# Patient Record
Sex: Female | Born: 1995 | Race: Black or African American | Hispanic: No | Marital: Single | Smoking: Never smoker
Health system: Southern US, Community
[De-identification: ages and names within clinical notes are randomized; demographics above are authoritative.]

## PROBLEM LIST (undated history)

## (undated) ENCOUNTER — Inpatient Hospital Stay (HOSPITAL_COMMUNITY): Payer: Self-pay

## (undated) DIAGNOSIS — L52 Erythema nodosum: Secondary | ICD-10-CM

## (undated) DIAGNOSIS — N6320 Unspecified lump in the left breast, unspecified quadrant: Secondary | ICD-10-CM

## (undated) DIAGNOSIS — R739 Hyperglycemia, unspecified: Secondary | ICD-10-CM

## (undated) DIAGNOSIS — E0590 Thyrotoxicosis, unspecified without thyrotoxic crisis or storm: Secondary | ICD-10-CM

## (undated) DIAGNOSIS — L309 Dermatitis, unspecified: Secondary | ICD-10-CM

## (undated) DIAGNOSIS — D649 Anemia, unspecified: Secondary | ICD-10-CM

## (undated) HISTORY — DX: Hyperglycemia, unspecified: R73.9

## (undated) HISTORY — DX: Anemia, unspecified: D64.9

## (undated) HISTORY — PX: BREAST BIOPSY: SHX20

## (undated) HISTORY — DX: Erythema nodosum: L52

## (undated) HISTORY — DX: Unspecified lump in the left breast, unspecified quadrant: N63.20

## (undated) HISTORY — PX: NO PAST SURGERIES: SHX2092

---

## 2017-10-07 HISTORY — PX: BREAST BIOPSY: SHX20

## 2020-04-06 LAB — OB RESULTS CONSOLE GC/CHLAMYDIA
Chlamydia: NEGATIVE
Gonorrhea: NEGATIVE

## 2020-04-06 LAB — OB RESULTS CONSOLE RUBELLA ANTIBODY, IGM: Rubella: IMMUNE

## 2020-04-06 LAB — OB RESULTS CONSOLE ABO/RH: RH Type: POSITIVE

## 2020-04-06 LAB — OB RESULTS CONSOLE HEPATITIS B SURFACE ANTIGEN: Hepatitis B Surface Ag: NEGATIVE

## 2020-04-06 LAB — OB RESULTS CONSOLE HIV ANTIBODY (ROUTINE TESTING): HIV: NONREACTIVE

## 2020-06-06 ENCOUNTER — Other Ambulatory Visit: Payer: Self-pay

## 2020-06-06 ENCOUNTER — Encounter (HOSPITAL_BASED_OUTPATIENT_CLINIC_OR_DEPARTMENT_OTHER): Payer: Self-pay | Admitting: *Deleted

## 2020-06-06 ENCOUNTER — Inpatient Hospital Stay (HOSPITAL_BASED_OUTPATIENT_CLINIC_OR_DEPARTMENT_OTHER)
Admission: AD | Admit: 2020-06-06 | Discharge: 2020-06-06 | Discharge status: Against Medical Advice | Payer: Medicaid Other | Attending: Obstetrics and Gynecology | Admitting: Obstetrics and Gynecology

## 2020-06-06 DIAGNOSIS — Z3A14 14 weeks gestation of pregnancy: Secondary | ICD-10-CM | POA: Diagnosis not present

## 2020-06-06 DIAGNOSIS — R109 Unspecified abdominal pain: Secondary | ICD-10-CM

## 2020-06-06 DIAGNOSIS — R1030 Lower abdominal pain, unspecified: Secondary | ICD-10-CM | POA: Diagnosis not present

## 2020-06-06 DIAGNOSIS — O26892 Other specified pregnancy related conditions, second trimester: Secondary | ICD-10-CM

## 2020-06-06 DIAGNOSIS — Z3A13 13 weeks gestation of pregnancy: Secondary | ICD-10-CM

## 2020-06-06 HISTORY — DX: Thyrotoxicosis, unspecified without thyrotoxic crisis or storm: E05.90

## 2020-06-06 HISTORY — DX: Dermatitis, unspecified: L30.9

## 2020-06-06 LAB — URINALYSIS, ROUTINE W REFLEX MICROSCOPIC
Bilirubin Urine: NEGATIVE
Glucose, UA: NEGATIVE mg/dL
Hgb urine dipstick: NEGATIVE
Ketones, ur: 15 mg/dL — AB
Leukocytes,Ua: NEGATIVE
Nitrite: NEGATIVE
Protein, ur: NEGATIVE mg/dL
Specific Gravity, Urine: 1.02 (ref 1.005–1.030)
pH: 8.5 — ABNORMAL HIGH (ref 5.0–8.0)

## 2020-06-06 LAB — PREGNANCY, URINE: Preg Test, Ur: POSITIVE — AB

## 2020-06-06 LAB — WET PREP, GENITAL
Sperm: NONE SEEN
Trich, Wet Prep: NONE SEEN
Yeast Wet Prep HPF POC: NONE SEEN

## 2020-06-06 MED ORDER — ACETAMINOPHEN 500 MG PO TABS
1000.0000 mg | ORAL_TABLET | Freq: Once | ORAL | Status: AC
  Administered 2020-06-06: 1000 mg via ORAL
  Filled 2020-06-06: qty 2

## 2020-06-06 NOTE — ED Triage Notes (Signed)
Pt reports lower abd pain x this am, denies any bleeding or discharge.

## 2020-06-06 NOTE — MAU Provider Note (Cosign Needed)
History     CSN: [id number]  Arrival date and time: 06/06/20 1202   First Provider Initiated Contact with Patient 06/06/20 1231      Chief Complaint  Patient presents with  . pregnant-abd pain   24 y.o. G3P1011 @13 .6 wks sent from [facility] with [facility]. Pain started this am. Pain was initially 8/10, now 5/10 after Tylenol. Pain is worse on left side. Denies fevers. Hx of constipation with last BM 2 days ago. Denies N/V. Denies vaginal bleeding, discharge, itching, or malodor. She was getting care in [facility] and told she had [facility] and placental separation based on [facility].  OB History    Gravida  3   Para  1   Term  1   Preterm  0   AB  1   Living  1     SAB  1   TAB      Ectopic      Multiple      Live Births  1           Past Medical History:  Diagnosis Date  . Eczema   . Hyperthyroidism     Past Surgical History:  Procedure Laterality Date  . NO PAST SURGERIES      Family History  Problem Relation Age of Onset  . Arthritis Mother        rheumatoid  . Hyperlipidemia Mother   . Healthy Father     Social History   Tobacco Use  . Smoking status: Never Smoker  . Smokeless tobacco: Never Used  Vaping Use  . Vaping Use: Never used  Substance Use Topics  . Alcohol use: Never  . Drug use: Never    Allergies: No Known Allergies  Medications Prior to Admission  Medication Sig Dispense Refill Last Dose  . Prenatal Vit-Fe Fumarate-FA (PRENATAL VITAMIN PO) Take by mouth.   06/05/2020 at Unknown time    Review of Systems  Constitutional: Negative for chills and fever.  Gastrointestinal: Positive for abdominal pain and constipation.  Genitourinary: Negative for dysuria, frequency, urgency, vaginal bleeding and vaginal discharge.   Physical Exam   Blood pressure 112/70, pulse 86, temperature 98.9 F (37.2 C), temperature source Oral, resp. rate 18, height 5\' 5"  (1.651 m), weight 69.2 kg, last menstrual period 02/28/2020, SpO2 100 %.  Physical Exam Vitals and  nursing note reviewed. Exam conducted with a chaperone present.  Constitutional:      Appearance: Normal appearance.  HENT:     Head: Normocephalic and atraumatic.  Cardiovascular:     Rate and Rhythm: Normal rate.  Pulmonary:     Effort: Pulmonary effort is normal. No respiratory distress.  Abdominal:     General: There is no distension.     Palpations: Abdomen is soft.     Tenderness: There is abdominal tenderness in the left lower quadrant.  Genitourinary:    Comments: External: no lesions or erythema Uterus: + enlarged 14wk, anteverted, non tender, no CMT Adnexae: no masses, no tenderness left, no tenderness right Cervix closed/long  Musculoskeletal:        General: Normal range of motion.     Cervical back: Normal range of motion.  Skin:    General: Skin is warm and dry.  Neurological:     General: No focal deficit present.     Mental Status: She is alert and oriented to person, place, and time.  Psychiatric:        Mood and Affect: Mood normal.   FHT 148  Results for orders placed or performed during the hospital encounter of 06/06/20 (from the past 24 hour(s))  Pregnancy, urine     Status: Abnormal   Collection Time: 06/06/20 10:27 AM  Result Value Ref Range   Preg Test, Ur POSITIVE (A) NEGATIVE  Urinalysis, Routine w reflex microscopic Urine, Clean Catch     Status: Abnormal   Collection Time: 06/06/20 10:27 AM  Result Value Ref Range   Color, Urine YELLOW YELLOW   APPearance CLOUDY (A) CLEAR   Specific Gravity, Urine 1.020 1.005 - 1.030   pH 8.5 (H) 5.0 - 8.0   Glucose, UA NEGATIVE NEGATIVE mg/dL   Hgb urine dipstick NEGATIVE NEGATIVE   Bilirubin Urine NEGATIVE NEGATIVE   Ketones, ur 15 (A) NEGATIVE mg/dL   Protein, ur NEGATIVE NEGATIVE mg/dL   Nitrite NEGATIVE NEGATIVE   Leukocytes,Ua NEGATIVE NEGATIVE  Wet prep, genital     Status: Abnormal   Collection Time: 06/06/20 12:44 PM   Specimen: PATH Cytology Cervicovaginal Ancillary Only  Result Value Ref  Range   Yeast Wet Prep HPF POC NONE SEEN NONE SEEN   Trich, Wet Prep NONE SEEN NONE SEEN   Clue Cells Wet Prep HPF POC PRESENT (A) NONE SEEN   WBC, Wet Prep HPF POC MANY (A) NONE SEEN   Sperm NONE SEEN    MAU Course  Procedures  MDM Labs ordered. Pt left AMA before completion of evaluation.  Assessment and Plan   1. Abdominal pain during pregnancy in second trimester   2. [redacted] weeks gestation of pregnancy    Left AMA Follow up at [facility] to start care- has appt  Donette Larry , CNM 06/06/2020, 12:43 PM

## 2020-06-06 NOTE — ED Notes (Signed)
ED Provider at bedside. 

## 2020-06-06 NOTE — ED Notes (Signed)
PER ED MD, all labs except urine preg, will be on hold and done at MAU

## 2020-06-06 NOTE — ED Provider Notes (Signed)
[facility] EMERGENCY DEPARTMENT Provider Note   CSN: [id number] Arrival date & time: 06/06/20  1314     History Chief Complaint  Patient presents with  . pregnant-abd pain    Alicia Turner is a 24 y.o. female.  24 yo F with a chief complaints of lower abdominal pain.  Is been going on since this morning.  Severe and sudden.  No nausea or vomiting.  Denies any urinary symptoms denies vaginal bleeding or discharge.  The patient is approximately [redacted] weeks pregnant.  And had an ultrasound a couple weeks ago in [place] that showed that she had a subchorionic hemorrhage.  This is her third pregnancy.  She has a 70-year-old child at home.  That pregnancy no complications.  She also has had a spontaneous miscarriage.  The history is provided by the patient.  Illness Severity:  Moderate Onset quality:  Sudden Duration:  12 hours Timing:  Constant Progression:  Unchanged Chronicity:  New Associated symptoms: abdominal pain   Associated symptoms: no chest pain, no congestion, no fever, no headaches, no myalgias, no nausea, no rhinorrhea, no shortness of breath, no vomiting and no wheezing        History reviewed. No pertinent past medical history.  There are no problems to display for this patient.   History reviewed. No pertinent surgical history.   OB History    Gravida  1   Para      Term      Preterm      AB      Living        SAB      TAB      Ectopic      Multiple      Live Births              No family history on file.  Social History   Tobacco Use  . Smoking status: Not on file  Substance Use Topics  . Alcohol use: Not on file  . Drug use: Not on file    Home Medications Prior to Admission medications   Not on File    Allergies    Patient has no known allergies.  Review of Systems   Review of Systems  Constitutional: Negative for chills and fever.  HENT: Negative for congestion and rhinorrhea.   Eyes: Negative  for redness and visual disturbance.  Respiratory: Negative for shortness of breath and wheezing.   Cardiovascular: Negative for chest pain and palpitations.  Gastrointestinal: Positive for abdominal pain. Negative for nausea and vomiting.  Genitourinary: Positive for pelvic pain. Negative for dysuria, urgency, vaginal bleeding and vaginal discharge.  Musculoskeletal: Negative for arthralgias and myalgias.  Skin: Negative for pallor and wound.  Neurological: Negative for dizziness and headaches.    Physical Exam Updated Vital Signs BP (!) 122/54 (BP Location: Right Arm)   Pulse (!) 55   Temp 98.6 F (37 C) (Oral)   Resp (!) 22   Ht 5\' 5"  (1.651 m)   Wt 71.7 kg   LMP 02/28/2020   SpO2 100%   BMI 26.29 kg/m   Physical Exam Vitals and nursing note reviewed.  Constitutional:      General: She is not in acute distress.    Appearance: She is well-developed. She is not diaphoretic.  HENT:     Head: Normocephalic and atraumatic.  Eyes:     Pupils: Pupils are equal, round, and reactive to light.  Cardiovascular:     Rate  and Rhythm: Normal rate and regular rhythm.     Heart sounds: No murmur heard.  No friction rub. No gallop.   Pulmonary:     Effort: Pulmonary effort is normal.     Breath sounds: No wheezing or rales.  Abdominal:     General: There is no distension.     Palpations: Abdomen is soft.     Tenderness: There is abdominal tenderness.     Comments: Tenderness about the suprapubic and left lower quadrant region.  Musculoskeletal:        General: No tenderness.     Cervical back: Normal range of motion and neck supple.  Skin:    General: Skin is warm and dry.  Neurological:     Mental Status: She is alert and oriented to person, place, and time.  Psychiatric:        Behavior: Behavior normal.     ED Results / Procedures / Treatments   Labs (all labs ordered are listed, but only abnormal results are displayed) Labs Reviewed  WET PREP, GENITAL  PREGNANCY,  URINE  URINALYSIS, ROUTINE W REFLEX MICROSCOPIC  CBC WITH DIFFERENTIAL/PLATELET  COMPREHENSIVE METABOLIC PANEL  LIPASE, BLOOD  HCG, QUANTITATIVE, PREGNANCY  ABO/RH  GC/CHLAMYDIA PROBE AMP ([place]) NOT AT [facility]    EKG None  Radiology No results found.  Procedures Procedures (including critical care time)  Medications Ordered in ED Medications - No data to display  ED Course  I have reviewed the triage vital signs and the nursing notes.  Pertinent labs & imaging results that were available during my care of the patient were reviewed by me and considered in my medical decision making (see chart for details).    MDM Rules/Calculators/A&P                          24 yo F with a chief complaints of lower abdominal discomfort.  Going on since this morning.  Nothing seems to make it better or worse.  No fevers no vomiting no vaginal bleeding or discharge no urinary symptoms.  The patient is [redacted] weeks pregnant.  Unfortunately we do not have ultrasound capability this morning.  I discussed the case with the MAU provider who accepted the patient in transfer there.  Let the patient go POV.  The patients results and plan were reviewed and discussed.   Any x-rays performed were independently reviewed by myself.   Differential diagnosis were considered with the presenting HPI.  Medications - No data to display  Vitals:   06/06/20 0943 06/06/20 1001  BP:  (!) 122/54  Pulse:  (!) 55  Resp:  (!) 22  Temp:  98.6 F (37 C)  TempSrc:  Oral  SpO2:  100%  Weight: 71.7 kg   Height: 5\' 5"  (1.651 m)     Final diagnoses:  Abdominal pain during pregnancy in second trimester     Final Clinical Impression(s) / ED Diagnoses Final diagnoses:  Abdominal pain during pregnancy in second trimester    Rx / DC Orders ED Discharge Orders    None       , DO 06/06/20 1106

## 2020-06-06 NOTE — ED Notes (Signed)
Unable to speak to nurse at [facility], spoke with Gearldine Bienenstock, Pt Coordinator, states will have RN to return call.

## 2020-06-06 NOTE — MAU Note (Signed)
Sharp pain across lower abd started this morning.  Denies bleeding or d/c. Sent over from [facility] for [place] and further eval.  Was given pain medication there, so is starting to improve.

## 2020-06-06 NOTE — ED Notes (Signed)
Report given to [facility] RN at [facility]

## 2020-06-06 NOTE — Discharge Instructions (Addendum)
Go to the MAU now.  There are OB/GYN providers that can evaluate you for your abdominal pain during pregnancy.  There is also ultrasound capability available.

## 2020-06-06 NOTE — ED Notes (Signed)
Transferred to MAU, via private vehicle

## 2020-06-07 LAB — GC/CHLAMYDIA PROBE AMP (~~LOC~~) NOT AT ARMC
Chlamydia: NEGATIVE
Comment: NEGATIVE
Comment: NORMAL
Neisseria Gonorrhea: NEGATIVE

## 2020-06-11 ENCOUNTER — Encounter (HOSPITAL_COMMUNITY): Payer: Self-pay

## 2020-06-11 ENCOUNTER — Other Ambulatory Visit: Payer: Self-pay

## 2020-06-11 ENCOUNTER — Inpatient Hospital Stay (HOSPITAL_COMMUNITY)
Admission: AD | Admit: 2020-06-11 | Discharge: 2020-06-11 | Disposition: A | Discharge status: Home / Self Care | Payer: Medicaid Other | Attending: Obstetrics and Gynecology | Admitting: Obstetrics and Gynecology

## 2020-06-11 DIAGNOSIS — E039 Hypothyroidism, unspecified: Secondary | ICD-10-CM | POA: Insufficient documentation

## 2020-06-11 DIAGNOSIS — N6310 Unspecified lump in the right breast, unspecified quadrant: Secondary | ICD-10-CM

## 2020-06-11 DIAGNOSIS — Z3A14 14 weeks gestation of pregnancy: Secondary | ICD-10-CM | POA: Diagnosis not present

## 2020-06-11 DIAGNOSIS — N644 Mastodynia: Secondary | ICD-10-CM

## 2020-06-11 DIAGNOSIS — O9229 Other disorders of breast associated with pregnancy and the puerperium: Secondary | ICD-10-CM | POA: Diagnosis not present

## 2020-06-11 NOTE — ED Triage Notes (Signed)
Emergency Medicine Provider OB Triage Evaluation Note  Alicia Turner is a 24 y.o. female, G3P1011, at [redacted]w[redacted]d gestation who presents to the emergency department with complaints of right breast pain x 3 weeks.  Review of  Systems  Positive: breast pain Negative: redness, drainage  Physical Exam  BP 114/65 (BP Location: Right Arm)   Pulse 71   Temp 98.8 F (37.1 C) (Oral)   Resp 16   LMP 02/28/2020 Comment: Gravida 3, Para 1, AB 1; edc by [place] end of July  SpO2 99%  General: Awake, no distress  HEENT: Atraumatic  Resp: Normal effort  Cardiac: Normal rate Abd: Nondistended, nontender  MSK: Moves all extremities without difficulty Neuro: Speech clear  Medical Decision Making  Pt evaluated for pregnancy concern and is stable for transfer to MAU. Pt is in agreement with plan for transfer.  11:26 AM Discussed with MAU APP, who accepts patient in transfer.  Clinical Impression  No diagnosis found.     Jeannie Fend, PA-C 06/11/20 1126

## 2020-06-11 NOTE — ED Triage Notes (Signed)
Patient complains of right breast pain for several weeks. States that she is [redacted] weeks pregnant and concerned with breast pain. No redness noted

## 2020-06-11 NOTE — MAU Provider Note (Signed)
Chief Complaint: Breast Pain   First Provider Initiated Contact with Patient 06/11/20 1350    SUBJECTIVE HPI: Alicia Turner is a 24 y.o. G3P1011 at [redacted]w[redacted]d by LMP who presents to maternity admissions reporting concern of painful right breast mass. Pt reports worsening breast pain over the past several weeks that has continued to worsen despite use of tylenol and warm compresses at home. She states that initially the upper area of her right breast was tender and soft but then became hardened and more painful. Today she rates her pain as a 9/10 without radiation to the axilla or back. No nipple discharge, nipple inversion, skin changes, skin redness or rash, enlarged lymph nodes, fever or chills. Pt reports biopsy of L breast during her last pregnancy with reportedly benign results. She has a family history of breast cancer (paternal grandmother). She recently moved to [place] several weeks ago. Has scheduled her first prenatal appointment with [facility] for 9/17. No current health insurance.  She denies vaginal bleeding, vaginal itching/burning, urinary symptoms, h/a, dizziness, n/v, or fever/chills.     Past Medical History:  Diagnosis Date   Eczema    Hyperthyroidism    Past Surgical History:  Procedure Laterality Date   NO PAST SURGERIES     Social History   Socioeconomic History   Marital status: Single    Spouse name: Not on file   Number of children: Not on file   Years of education: Not on file   Highest education level: Not on file  Occupational History   Not on file  Tobacco Use   Smoking status: Never Smoker   Smokeless tobacco: Never Used  Vaping Use   Vaping Use: Never used  Substance and Sexual Activity   Alcohol use: Never   Drug use: Never   Sexual activity: Yes  Other Topics Concern   Not on file  Social History Narrative   Not on file   Social Determinants of Health   Financial Resource Strain:    Difficulty of Paying Living Expenses: Not on  file  Food Insecurity:    Worried About Running Out of Food in the Last Year: Not on file   Ran Out of Food in the Last Year: Not on file  Transportation Needs:    Lack of Transportation (Medical): Not on file   Lack of Transportation (Non-Medical): Not on file  Physical Activity:    Days of Exercise per Week: Not on file   Minutes of Exercise per Session: Not on file  Stress:    Feeling of Stress : Not on file  Social Connections:    Frequency of Communication with Friends and Family: Not on file   Frequency of Social Gatherings with Friends and Family: Not on file   Attends Religious Services: Not on file   Active Member of Clubs or Organizations: Not on file   Attends Banker Meetings: Not on file   Marital Status: Not on file  Intimate Partner Violence:    Fear of Current or Ex-Partner: Not on file   Emotionally Abused: Not on file   Physically Abused: Not on file   Sexually Abused: Not on file   No current facility-administered medications on file prior to encounter.   Current Outpatient Medications on File Prior to Encounter  Medication Sig Dispense Refill   Prenatal Vit-Fe Fumarate-FA (PRENATAL VITAMIN PO) Take by mouth.     No Known Allergies  ROS:  Review of Systems  Constitutional: Negative for appetite change, chills  and fever.  HENT: Negative for congestion and sore throat.   Eyes: Negative for photophobia and visual disturbance.  Respiratory: Negative for chest tightness, shortness of breath and wheezing.   Cardiovascular: Negative for chest pain, palpitations and leg swelling.  Gastrointestinal: Negative for abdominal pain, constipation, diarrhea, nausea and vomiting.  Genitourinary: Negative for dysuria, vaginal bleeding, vaginal discharge and vaginal pain.  Musculoskeletal: Negative for back pain.  Skin: Negative for color change and rash.  Neurological: Negative for light-headedness.  Hematological: Negative for adenopathy.  Does not bruise/bleed easily.   I have reviewed patient's Past Medical Hx, Surgical Hx, Family Hx, Social Hx, medications and allergies.   Physical Exam   Patient Vitals for the past 24 hrs:  BP Temp Temp src Pulse Resp SpO2  06/11/20 1217 109/66 98.7 F (37.1 C) Oral 63 16 100 %  06/11/20 1047 114/65 98.8 F (37.1 C) Oral 71 16 99 %   Constitutional: Well-developed, well-nourished female in no acute distress.  Cardiovascular: normal rate Respiratory: normal effort Breast: L breast without palpable masses; R breast with 10cm x8 cm indurated, tender mass along superior aspect of breast (11 o'clock to 2 o'clock location), no visible nipple inversion or nipple discharge. No overlying skin redness or lesions. No palpable lymphadenopathy in axilla bilaterally. GI: Abd soft, non-tender. Pos BS x 4 MS: Extremities nontender, no edema, normal ROM Neurologic: Alert and oriented x 4.  GU: deferred  FHT 153 by doppler  LAB RESULTS No results found for this or any previous visit (from the past 24 hour(s)).  IMAGING No results found.  MAU Management/MDM: Orders Placed This Encounter  Procedures   Discharge patient Discharge disposition: 01-Home or Self Care; Discharge patient date: 06/11/2020    No orders of the defined types were placed in this encounter.   Discussed plan for f/u with [facility] with Dr. Catalina Antigua.   Pt discharged with strict return precautions for fever, worsening breast pain, vaginal bleeding and severe abdominal pain.  ASSESSMENT 1. Large mass of right breast: Pt presents with concern of large, painful mass of right breast that has worsened over the past several weeks. Breast pain and changes most likely benign changes with hormonal changes in pregnancy but will refer with prompt f/u with [facility] to evaluate further to rule out malignancy given positive family history. Breast abscess or infection unlikely given findings on exam and no measured or reported fever or chills.   -discussed pt with Dr. Jolayne Panther with plan for prompt f/u with [facility] at [facility] location (sent in-basket message to [employer] with [facility] and also provided pt with address & contact info to clinic to ensure follow-up) -discussed strict return precautions for fever, worsening breast pain, vaginal bleeding and severe abdominal pain as noted above  2. Breast pain, right: Most consistent with benign breast changes in pregnancy as noted above. -recommended use of ice packs and warm compresses in addition to scheduled tylenol every 6 hours as needed for discomfort; also discussed importance of supportive (but not compressive) bras for additional support    PLAN Discharge home Allergies as of 06/11/2020   No Known Allergies     Medication List    TAKE these medications   PRENATAL VITAMIN PO Take by mouth.       Lynnda Shields, MD OB Fellow, Faculty Practice 06/11/2020  5:16 PM

## 2020-06-11 NOTE — MAU Note (Signed)
Alicia Turner is a 24 y.o. at [redacted]w[redacted]d here in MAU reporting: mass in her right breast for the past couple of weeks, states it has gotten worse. Has tried warm compresses and showers. States area is painful.  Onset of complaint: ongoing  Pain score: 9/10  Vitals:   06/11/20 1047 06/11/20 1217  BP: 114/65 109/66  Pulse: 71 63  Resp: 16 16  Temp: 98.8 F (37.1 C) 98.7 F (37.1 C)  SpO2: 99% 100%     FHT: 153  Lab orders placed from triage: none

## 2020-06-11 NOTE — Discharge Instructions (Signed)
You were seen today for concern of a painful right-sided breast mass. No concern of infection at this time given lack of fever and findings on exam. -You may take tylenol up to 1000mg  every 8 hours in addition to use of ice packs and warm compresses. It may also be helpful to change the type of bra you are wearing (ensure supportive but not too tight). -We placed a message to the [facility] program for further evaluation of your breast mass. You can expect a call this week--if you do not hear from them by Wed, Sept 7th, I would recommend that you call the clinic:  [facility] @ [address] [place], [place] P# [phone]

## 2020-06-13 ENCOUNTER — Other Ambulatory Visit: Payer: Self-pay

## 2020-06-13 DIAGNOSIS — N6310 Unspecified lump in the right breast, unspecified quadrant: Secondary | ICD-10-CM

## 2020-06-15 ENCOUNTER — Other Ambulatory Visit: Payer: Self-pay

## 2020-06-15 ENCOUNTER — Ambulatory Visit: Payer: Self-pay | Admitting: *Deleted

## 2020-06-15 VITALS — Temp 97.1°F | Wt 151.9 lb

## 2020-06-15 DIAGNOSIS — Z1239 Encounter for other screening for malignant neoplasm of breast: Secondary | ICD-10-CM

## 2020-06-15 DIAGNOSIS — N6310 Unspecified lump in the right breast, unspecified quadrant: Secondary | ICD-10-CM

## 2020-06-15 NOTE — Progress Notes (Signed)
[name] is a 24 y.o. female who presents to [facility] clinic today with complaint of right breast lump x one month that has increased in size. Patient complained of right breast lump pain when touched. Patient rates the pain at a 8-9 out of 10. Patient is currently [redacted] weeks pregnant.   Pap Smear: Pap smear not completed today. Last Pap smear was in 2019 at a clinic in [place] and was normal per patient. Per patient has no history of an abnormal Pap smear. Last Pap smear result is not available in Epic.   Physical exam: Breasts Right breast slightly larger than left breast that per patient is a change since she noticed right breast lump. No skin abnormalities bilateral breasts. No nipple retraction bilateral breasts. No nipple discharge bilateral breasts. No lymphadenopathy. No lumps palpated left breast. Palpated a right breast mass between 9 o'clock and 1 o'clock 1 cm from the nipple. Patient complained of pain when palpated right breast lump on exam.      Pelvic/Bimanual Pap is not indicated today per [facility] guidelines.   Smoking History: Patient has never smoked.    Patient Navigation: Patient education provided. Access to services provided for patient through [facility] program.    Breast and Cervical Cancer Risk Assessment: Patient has a family history of her maternal grandmother having breast cancer. Patient has no known genetic mutations or history of radiation treatment to the chest before age 18. Patient does not have history of cervical dysplasia, immunocompromised, or DES exposure in-utero. Breast cancer risk assessment completed. No breast cancer risk calculated due to patient is less than 26 years old.  Risk Assessment    Risk Scores      06/15/2020   Last edited by: Meryl Dare, CMA   5-year risk:    Lifetime risk:           A: [facility] exam without pap smear Complaint of right breast lump.  P: Referred patient to the [facility] for a right breast  ultrasound. Appointment scheduled Friday, June 16, 2020 at 0910.  Priscille Heidelberg, RN 06/15/2020 10:03 AM

## 2020-06-15 NOTE — Patient Instructions (Addendum)
Explained breast self awareness with [name]. Patient did not need a Pap smear today due to last Pap smear was in 2019 per patient. Let her know [facility] will cover Pap smears every 3 years unless has a history of abnormal Pap smears. Referred patient to the [facility] for a right breast ultrasound. Appointment scheduled Friday, June 16, 2020 at 0910. Patient aware of appointment and will be there. Alicia Turner verbalized understanding.  Alicia Turner, [name], RN 10:03 AM

## 2020-06-16 ENCOUNTER — Other Ambulatory Visit: Payer: Self-pay | Admitting: Obstetrics and Gynecology

## 2020-06-16 ENCOUNTER — Ambulatory Visit
Admission: RE | Admit: 2020-06-16 | Discharge: 2020-06-16 | Disposition: A | Discharge status: Home / Self Care | Payer: No Typology Code available for payment source | Source: Ambulatory Visit | Attending: Obstetrics and Gynecology | Admitting: Obstetrics and Gynecology

## 2020-06-16 ENCOUNTER — Inpatient Hospital Stay (HOSPITAL_COMMUNITY)
Admission: AD | Admit: 2020-06-16 | Discharge: 2020-06-16 | Disposition: A | Discharge status: Home / Self Care | Payer: Medicaid Other | Attending: Obstetrics and Gynecology | Admitting: Obstetrics and Gynecology

## 2020-06-16 ENCOUNTER — Ambulatory Visit
Admission: RE | Admit: 2020-06-16 | Discharge: 2020-06-16 | Disposition: A | Discharge status: Home / Self Care | Payer: Self-pay | Source: Ambulatory Visit | Attending: Obstetrics and Gynecology | Admitting: Obstetrics and Gynecology

## 2020-06-16 DIAGNOSIS — O26892 Other specified pregnancy related conditions, second trimester: Secondary | ICD-10-CM | POA: Diagnosis not present

## 2020-06-16 DIAGNOSIS — O99891 Other specified diseases and conditions complicating pregnancy: Secondary | ICD-10-CM | POA: Diagnosis not present

## 2020-06-16 DIAGNOSIS — R52 Pain, unspecified: Secondary | ICD-10-CM

## 2020-06-16 DIAGNOSIS — N6310 Unspecified lump in the right breast, unspecified quadrant: Secondary | ICD-10-CM

## 2020-06-16 DIAGNOSIS — M545 Low back pain: Secondary | ICD-10-CM | POA: Insufficient documentation

## 2020-06-16 DIAGNOSIS — Z3A15 15 weeks gestation of pregnancy: Secondary | ICD-10-CM | POA: Insufficient documentation

## 2020-06-16 DIAGNOSIS — Z20822 Contact with and (suspected) exposure to covid-19: Secondary | ICD-10-CM | POA: Diagnosis not present

## 2020-06-16 DIAGNOSIS — M25572 Pain in left ankle and joints of left foot: Secondary | ICD-10-CM | POA: Diagnosis not present

## 2020-06-16 HISTORY — PX: BREAST BIOPSY: SHX20

## 2020-06-16 LAB — RESP PANEL BY RT PCR (RSV, FLU A&B, COVID)
Influenza A by PCR: NEGATIVE
Influenza B by PCR: NEGATIVE
Respiratory Syncytial Virus by PCR: NEGATIVE
SARS Coronavirus 2 by RT PCR: NEGATIVE

## 2020-06-16 LAB — URINALYSIS, ROUTINE W REFLEX MICROSCOPIC
Bilirubin Urine: NEGATIVE
Glucose, UA: >=500 mg/dL — AB
Hgb urine dipstick: NEGATIVE
Ketones, ur: 80 mg/dL — AB
Nitrite: NEGATIVE
Protein, ur: NEGATIVE mg/dL
Specific Gravity, Urine: 1.025 (ref 1.005–1.030)
pH: 5 (ref 5.0–8.0)

## 2020-06-16 MED ORDER — CYCLOBENZAPRINE HCL 10 MG PO TABS: 10.0000 mg | ORAL_TABLET | Freq: Two times a day (BID) | ORAL | 0 refills | Status: DC | PRN

## 2020-06-16 MED ORDER — CYCLOBENZAPRINE HCL 5 MG PO TABS
10.0000 mg | ORAL_TABLET | Freq: Once | ORAL | Status: AC
  Administered 2020-06-16: 10 mg via ORAL
  Filled 2020-06-16: qty 2

## 2020-06-16 NOTE — Discharge Instructions (Signed)
Ankle Pain The ankle joint holds your body weight and allows you to move around. Ankle pain can occur on either side or the back of one ankle or both ankles. Ankle pain may be sharp and burning or dull and aching. There may be tenderness, stiffness, redness, or warmth around the ankle. Many things can cause ankle pain, including an injury to the area and overuse of the ankle. Follow these instructions at home: Activity  Rest your ankle as told by your health care provider. Avoid any activities that cause ankle pain.  Do not use the injured limb to support your body weight until your health care provider says that you can. Use crutches as told by your health care provider.  Do exercises as told by your health care provider.  Ask your health care provider when it is safe to drive if you have a brace on your ankle. If you have a brace:  Wear the brace as told by your health care provider. Remove it only as told by your health care provider.  Loosen the brace if your toes tingle, become numb, or turn cold and blue.  Keep the brace clean.  If the brace is not waterproof: ? Do not let it get wet. ? Cover it with a watertight covering when you take a bath or shower. If you were given an elastic bandage:   Remove it when you take a bath or a shower.  Try not to move your ankle very much, but wiggle your toes from time to time. This helps to prevent swelling.  Adjust the bandage to make it more comfortable if it feels too tight.  Loosen the bandage if you have numbness or tingling in your foot or if your foot turns cold and blue. Managing pain, stiffness, and swelling   If directed, put ice on the painful area. ? If you have a removable brace or elastic bandage, remove it as told by your health care provider. ? Put ice in a plastic bag. ? Place a towel between your skin and the bag. ? Leave the ice on for 20 minutes, 2-3 times a day.  Move your toes often to avoid stiffness and to  lessen swelling.  Raise (elevate) your ankle above the level of your heart while you are sitting or lying down. General instructions  Record information about your pain. Writing down the following may be helpful for you and your health care provider: ? How often you have ankle pain. ? Where the pain is located. ? What the pain feels like.  If treatment involves wearing a prescribed shoe or insole, make sure you wear it correctly and for as long as told by your health care provider.  Take over-the-counter and prescription medicines only as told by your health care provider.  Keep all follow-up visits as told by your health care provider. This is important. Contact a health care provider if:  Your pain gets worse.  Your pain is not relieved with medicines.  You have a fever or chills.  You are having more trouble with walking.  You have new symptoms. Get help right away if:  Your foot, leg, toes, or ankle: ? Tingles or becomes numb. ? Becomes swollen. ? Turns pale or blue. Summary  Ankle pain can occur on either side or the back of one ankle or both ankles.  Ankle pain may be sharp and burning or dull and aching.  Rest your ankle as told by your health care provider.   If told, apply ice to the area.  Take over-the-counter and prescription medicines only as told by your health care provider. This information is not intended to replace advice given to you by your health care provider. Make sure you discuss any questions you have with your health care provider. Document Revised: 01/12/2019 Document Reviewed: 04/01/2018 Elsevier Patient Education  2020 [employer]  

## 2020-06-16 NOTE — MAU Note (Addendum)
Yesterday started having pains in L outer ankle that then went around to back of ankle. Then R heel started hurting that esp hurt when I put my heel down. Then pain went up to one knee and then both knees hurt now. Has pain in lower back that shoots up spine to neck. My left elbow also hurts. Denies VB or d/c. All the pain is less when I do not move much except in my R heel

## 2020-06-16 NOTE — MAU Provider Note (Addendum)
First Provider Initiated Contact with Patient 06/16/20 [phone]      S [name] is a 24 y.o. G4P1011 at [redacted]w[redacted]d who presents to MAU today with complaint of body aches. Patient reports that pain started occurring in L outer ankle then effected the back of the ankle where tendon is location. Patient reports limping to her appointment yesterday but "did not pay it any mind". This morning R heel started hurting especially when pressure was applied then pain started occurring in multiple locations - knee, lower back, spine, neck, elbow. Patient reports taking Tylenol for pain without relief. Patient reports that chills is associated with body aches. Denies fever, sore throat, cough, SOB, or being around anyone sick.   Denies any pregnancy related concerns. Denies vaginal bleeding or discharge. Denies nausea or vomiting.   O BP 111/67 (BP Location: Right Arm)   Pulse 89   Temp 98.9 F (37.2 C) (Oral)   Resp 15   Ht 5\' 5"  (1.651 m)   Wt 68.9 kg   LMP 02/28/2020 Comment: Gravida 3, Para 1, AB 1; edc by 03/01/2020 end of July  SpO2 100%   BMI 25.29 kg/m  Physical Exam HENT:     Head: Normocephalic.  Cardiovascular:     Rate and Rhythm: Normal rate and regular rhythm.  Pulmonary:     Effort: Pulmonary effort is normal.     Breath sounds: Normal breath sounds.  Abdominal:     Palpations: Abdomen is soft. There is no mass.     Tenderness: There is no abdominal tenderness. There is no guarding.  Musculoskeletal:        General: Tenderness present. No swelling.     Right lower leg: No edema.     Left lower leg: No edema.     Comments: Left ankle tenderness   Skin:    General: Skin is warm and dry.  Neurological:     General: No focal deficit present.     Mental Status: She is alert and oriented to person, place, and time.     Sensory: No sensory deficit.     Motor: No weakness.     Coordination: Coordination normal.     Gait: Gait normal.     Deep Tendon Reflexes: Reflexes normal.   Psychiatric:        Mood and Affect: Mood normal.        Behavior: Behavior normal.        Thought Content: Thought content normal.    FHR 160 by doppler   A Consult with [facility] ED provider COVID screening and encouraged patient to wear brace for ankle and elevated when sitting  Flexeril given in MAU - patient reports pain is completely resolved after treatment Medical screening exam complete  P Discharge from MAU in stable condition Follow up as scheduled for prenatal care Warning signs for worsening condition that would warrant emergency follow-up discussed Return to MAU as needed for reasons discussed   [facility] 06/16/2020 4:25 AM   Patient gave UA sample after discharged. Result returned and reviewed. Noted excessive glucose in urine. Called patient to discuss results and given instructions to take CBG with meter. Encouraged to take CBG 4x a day if numbers continues to be over 120. Instructed to return to MAU with severely elevated CBGs, over 200s.  08/16/2020, CNM 06/16/20, 7:21 AM

## 2020-06-16 NOTE — MAU Note (Signed)
Veronica Rogers CNM in Triage to see pt.  

## 2020-06-20 ENCOUNTER — Other Ambulatory Visit: Payer: Self-pay | Admitting: Obstetrics and Gynecology

## 2020-06-20 DIAGNOSIS — N611 Abscess of the breast and nipple: Secondary | ICD-10-CM

## 2020-06-21 LAB — AEROBIC/ANAEROBIC CULTURE W GRAM STAIN (SURGICAL/DEEP WOUND)
Culture: NO GROWTH
Special Requests: NORMAL

## 2020-06-26 ENCOUNTER — Inpatient Hospital Stay (HOSPITAL_BASED_OUTPATIENT_CLINIC_OR_DEPARTMENT_OTHER): Payer: Medicaid Other

## 2020-06-26 ENCOUNTER — Other Ambulatory Visit: Payer: Self-pay

## 2020-06-26 ENCOUNTER — Inpatient Hospital Stay (HOSPITAL_COMMUNITY): Payer: Medicaid Other

## 2020-06-26 ENCOUNTER — Emergency Department (HOSPITAL_COMMUNITY)
Admission: AD | Admit: 2020-06-26 | Discharge: 2020-06-27 | Disposition: A | Discharge status: Home / Self Care | Payer: Medicaid Other | Attending: Emergency Medicine | Admitting: Emergency Medicine

## 2020-06-26 DIAGNOSIS — Z3A16 16 weeks gestation of pregnancy: Secondary | ICD-10-CM | POA: Diagnosis not present

## 2020-06-26 DIAGNOSIS — M79609 Pain in unspecified limb: Secondary | ICD-10-CM | POA: Diagnosis not present

## 2020-06-26 DIAGNOSIS — O99712 Diseases of the skin and subcutaneous tissue complicating pregnancy, second trimester: Secondary | ICD-10-CM | POA: Insufficient documentation

## 2020-06-26 DIAGNOSIS — O1202 Gestational edema, second trimester: Secondary | ICD-10-CM | POA: Diagnosis present

## 2020-06-26 DIAGNOSIS — L52 Erythema nodosum: Secondary | ICD-10-CM

## 2020-06-26 DIAGNOSIS — R609 Edema, unspecified: Secondary | ICD-10-CM

## 2020-06-26 LAB — CBC WITH DIFFERENTIAL/PLATELET
Abs Immature Granulocytes: 0.14 10*3/uL — ABNORMAL HIGH (ref 0.00–0.07)
Basophils Absolute: 0 10*3/uL (ref 0.0–0.1)
Basophils Relative: 0 %
Eosinophils Absolute: 0 10*3/uL (ref 0.0–0.5)
Eosinophils Relative: 0 %
HCT: 32.8 % — ABNORMAL LOW (ref 36.0–46.0)
Hemoglobin: 10.5 g/dL — ABNORMAL LOW (ref 12.0–15.0)
Immature Granulocytes: 1 %
Lymphocytes Relative: 19 %
Lymphs Abs: 3 10*3/uL (ref 0.7–4.0)
MCH: 28.9 pg (ref 26.0–34.0)
MCHC: 32 g/dL (ref 30.0–36.0)
MCV: 90.4 fL (ref 80.0–100.0)
Monocytes Absolute: 1 10*3/uL (ref 0.1–1.0)
Monocytes Relative: 6 %
Neutro Abs: 11.8 10*3/uL — ABNORMAL HIGH (ref 1.7–7.7)
Neutrophils Relative %: 74 %
Platelets: 409 10*3/uL — ABNORMAL HIGH (ref 150–400)
RBC: 3.63 MIL/uL — ABNORMAL LOW (ref 3.87–5.11)
RDW: 12.4 % (ref 11.5–15.5)
WBC: 15.9 10*3/uL — ABNORMAL HIGH (ref 4.0–10.5)
nRBC: 0 % (ref 0.0–0.2)

## 2020-06-26 LAB — SEDIMENTATION RATE: Sed Rate: 69 mm/hr — ABNORMAL HIGH (ref 0–22)

## 2020-06-26 LAB — C-REACTIVE PROTEIN: CRP: 2.7 mg/dL — ABNORMAL HIGH (ref <1.0)

## 2020-06-26 MED ORDER — DIPHENHYDRAMINE HCL 25 MG PO CAPS
50.0000 mg | ORAL_CAPSULE | Freq: Once | ORAL | Status: AC
  Administered 2020-06-26: 50 mg via ORAL
  Filled 2020-06-26: qty 2

## 2020-06-26 MED ORDER — OXYCODONE-ACETAMINOPHEN 5-325 MG PO TABS
2.0000 | ORAL_TABLET | Freq: Once | ORAL | Status: AC
  Administered 2020-06-26: 2 via ORAL
  Filled 2020-06-26: qty 2
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
## 2020-06-26 NOTE — MAU Provider Note (Signed)
History     CSN: [id number]  Arrival date and time: 06/26/20 1029   First Provider Initiated Contact with Patient 06/26/20 1133      Chief Complaint  Patient presents with  . Joint Swelling  . Foot Pain   HPI Alicia Turner is a 24 y.o. G4P1011 at [redacted]w[redacted]d who presents to MAU with chief complaint of recurrent heel and ankle pain as well as left ankle swelling. This has been a problem for the past two weeks. Patient was previously evaluated for this complaint and prescribed an ankle brace as well as PO Flexeril. She was unable to tolerate the ankle brace due to tenderness and did not find the Flexeril to be helpful so discontinued use.  Patient's mother is a Engineer, civil (consulting) and communicated that she thought the patient was suffering from bug bites. She gave the patient two Prednisone from an unknown source but patient did not experience relief.  She denies all ob-related complaints including abdominal pain, vaginal bleeding, dysruria, fever or recent illness. Patient has a New Ob appt at [facility] this week.  OB History    Gravida  4   Para  1   Term  1   Preterm  0   AB  1   Living  1     SAB  1   TAB      Ectopic      Multiple      Live Births  1           Past Medical History:  Diagnosis Date  . Eczema   . Hyperthyroidism     Past Surgical History:  Procedure Laterality Date  . NO PAST SURGERIES      Family History  Problem Relation Age of Onset  . Arthritis Mother        rheumatoid  . Hyperlipidemia Mother   . Healthy Father     Social History   Tobacco Use  . Smoking status: Never Smoker  . Smokeless tobacco: Never Used  Vaping Use  . Vaping Use: Never used  Substance Use Topics  . Alcohol use: Never  . Drug use: Never    Allergies: No Known Allergies  Medications Prior to Admission  Medication Sig Dispense Refill Last Dose  . cyclobenzaprine (FLEXERIL) 10 MG tablet Take 1 tablet (10 mg total) by mouth 2 (two) times daily as needed for muscle  spasms. 10 tablet 0   . Prenatal Vit-Fe Fumarate-FA (PRENATAL VITAMIN PO) Take by mouth.       Review of Systems  Musculoskeletal: Positive for arthralgias, gait problem, joint swelling and myalgias.  Skin: Positive for rash and wound.  Neurological: Negative for dizziness, syncope and weakness.  All other systems reviewed and are negative.  Physical Exam   Blood pressure 127/73, pulse 93, temperature 98.5 F (36.9 C), temperature source Oral, resp. rate 18, last menstrual period 02/28/2020, SpO2 100 %.  Physical Exam Vitals and nursing note reviewed. Exam conducted with a chaperone present.  Constitutional:      Appearance: Normal appearance.  Cardiovascular:     Rate and Rhythm: Normal rate.     Pulses: Normal pulses.     Heart sounds: Normal heart sounds.  Pulmonary:     Effort: Pulmonary effort is normal.     Breath sounds: Normal breath sounds.  Skin:    Capillary Refill: Capillary refill takes less than 2 seconds.     Findings: Bruising and erythema present.  Neurological:     General: No focal  deficit present.     Mental Status: She is alert.     MAU Course  Procedures   --Significant bruising on right shin with palpable nodule on lateral aspect of right calf --Left ankle swollen, TTP --Doppler studies negative for DVT --Additional workup discussed with Dr. Clarene Duke in [facility] to streamline evaluation. Recommends CBC w/ diff, inflammation markers, ankle Xray. Orders placed  .      Assessment and Plan  --24 y.o. G4P1011 at [redacted]w[redacted]d  --FHT 152 by Doppler --No ob-related complaints --Negative DVT --Ankle Xray negative for fracture --Updated report called to Dr. Donnald Garre --Transfer to [facility] in stable condition  Calvert Cantor, CNM 06/26/2020, 3:14 PM

## 2020-06-26 NOTE — ED Triage Notes (Signed)
Pt. Stated, I started having swelling a week ago but the pain started 2 weeks. No injury.  [redacted] weeks pregnant.

## 2020-06-26 NOTE — MAU Note (Addendum)
Ankles started swelling 5 days ago, rt just starting now.  Has had pain in both heels and left ankle, was having this when last seen. "unresponsive to everything" ice, heat, elevation".  Tried prednisone.  Was at the point that she couldn't walk. No longer having muscle pain.  No OB complaints

## 2020-06-27 LAB — I-STAT CHEM 8, ED
BUN: 6 mg/dL (ref 6–20)
Calcium, Ion: 1.2 mmol/L (ref 1.15–1.40)
Chloride: 99 mmol/L (ref 98–111)
Creatinine, Ser: 0.5 mg/dL (ref 0.44–1.00)
Glucose, Bld: 103 mg/dL — ABNORMAL HIGH (ref 70–99)
HCT: 30 % — ABNORMAL LOW (ref 36.0–46.0)
Hemoglobin: 10.2 g/dL — ABNORMAL LOW (ref 12.0–15.0)
Potassium: 3.5 mmol/L (ref 3.5–5.1)
Sodium: 137 mmol/L (ref 135–145)
TCO2: 27 mmol/L (ref 22–32)

## 2020-06-27 MED ORDER — ACETAMINOPHEN 500 MG PO TABS
1000.0000 mg | ORAL_TABLET | Freq: Once | ORAL | Status: AC
  Administered 2020-06-27: 1000 mg via ORAL
  Filled 2020-06-27: qty 2

## 2020-06-27 NOTE — Discharge Instructions (Addendum)
You may only take Tylenol for pain. Use cold compresses for additional symptomatic management.

## 2020-06-27 NOTE — ED Provider Notes (Signed)
[facility] EMERGENCY DEPARTMENT Provider Note  CSN: [id number] Arrival date & time: 06/26/20 1440  Chief Complaint(s) Joint Swelling and Foot Pain  HPI Alicia Turner is a 24 y.o. female   CC: bilateral leg pain  Onset/Duration: gradual for 2 weeks Timing: constant, worsening Location: Bilateral lower legs Quality: aching Severity: moderate to severe Modifying Factors:  Improved by: nothing  Worsened by: touch Associated Signs/Symptoms:  Pertinent (+): bilateral ankle swelling and pain.   Pertinent (-): fevers, chills, abd pain, chest pain, SOB. Context: she is 16w pregnant   HPI  Past Medical History Past Medical History:  Diagnosis Date  . Eczema   . Hyperthyroidism    There are no problems to display for this patient.  Home Medication(s) Prior to Admission medications   Medication Sig Start Date End Date Taking? Authorizing Provider  cyclobenzaprine (FLEXERIL) 10 MG tablet Take 1 tablet (10 mg total) by mouth 2 (two) times daily as needed for muscle spasms. 06/16/20   Sharyon Cable, CNM  Prenatal Vit-Fe Fumarate-FA (PRENATAL VITAMIN PO) Take by mouth.    [provider]                                                                                                                                    Past Surgical History Past Surgical History:  Procedure Laterality Date  . NO PAST SURGERIES     Family History Family History  Problem Relation Age of Onset  . Arthritis Mother        rheumatoid  . Hyperlipidemia Mother   . Healthy Father     Social History Social History   Tobacco Use  . Smoking status: Never Smoker  . Smokeless tobacco: Never Used  Vaping Use  . Vaping Use: Never used  Substance Use Topics  . Alcohol use: Never  . Drug use: Never   Allergies Patient has no known allergies.  Review of Systems Review of Systems All other systems are reviewed and are negative for acute change except as noted in the  HPI   Physical Exam Vital Signs  I have reviewed the triage vital signs BP 109/71 (BP Location: Right Arm)   Pulse 95   Temp 98.7 F (37.1 C) (Oral)   Resp 16   Ht 5\' 5"  (1.651 m)   Wt 68.5 kg   LMP 02/28/2020 Comment: Gravida 3, Para 1, AB 1; edc by 03/01/2020 end of July  SpO2 100%   BMI 25.13 kg/m   Physical Exam Vitals reviewed.  Constitutional:      General: She is not in acute distress.    Appearance: She is well-developed. She is not diaphoretic.  HENT:     Head: Normocephalic and atraumatic.     Nose: Nose normal.  Eyes:     General: No scleral icterus.       Right eye: No discharge.  Left eye: No discharge.     Conjunctiva/sclera: Conjunctivae normal.     Pupils: Pupils are equal, round, and reactive to light.  Cardiovascular:     Rate and Rhythm: Normal rate and regular rhythm.     Heart sounds: No murmur heard.  No friction rub. No gallop.   Pulmonary:     Effort: Pulmonary effort is normal. No respiratory distress.     Breath sounds: Normal breath sounds. No stridor. No rales.  Abdominal:     General: There is no distension.     Palpations: Abdomen is soft.     Tenderness: There is no abdominal tenderness.  Musculoskeletal:        General: No tenderness.     Cervical back: Normal range of motion and neck supple.     Right ankle: Swelling present.     Left ankle: Swelling present.     Comments: Several painful nodules on bilateral lower legs (see image)  Skin:    General: Skin is warm and dry.     Findings: No erythema or rash.  Neurological:     Mental Status: She is alert and oriented to person, place, and time.             ED Results and Treatments Labs (all labs ordered are listed, but only abnormal results are displayed) Labs Reviewed  CBC WITH DIFFERENTIAL/PLATELET - Abnormal; Notable for the following components:      Result Value   WBC 15.9 (*)    RBC 3.63 (*)    Hemoglobin 10.5 (*)    HCT 32.8 (*)    Platelets 409 (*)     Neutro Abs 11.8 (*)    Abs Immature Granulocytes 0.14 (*)    All other components within normal limits  C-REACTIVE PROTEIN - Abnormal; Notable for the following components:   CRP 2.7 (*)    All other components within normal limits  SEDIMENTATION RATE - Abnormal; Notable for the following components:   Sed Rate 69 (*)    All other components within normal limits  I-STAT CHEM 8, ED - Abnormal; Notable for the following components:   Glucose, Bld 103 (*)    Hemoglobin 10.2 (*)    HCT 30.0 (*)    All other components within normal limits                                                                                                                         EKG  EKG Interpretation  Date/Time:    Ventricular Rate:    PR Interval:    QRS Duration:   QT Interval:    QTC Calculation:   R Axis:     Text Interpretation:        Radiology DG Ankle Complete Left  Result Date: 06/26/2020 CLINICAL DATA:  Soft tissue swelling and pain EXAM: LEFT ANKLE COMPLETE - 3+ VIEW COMPARISON:  None. FINDINGS: Frontal, oblique, and lateral views were obtained. There is soft tissue swelling.  No evident fracture or joint effusion. No joint space narrowing or erosion. Ankle mortise appears intact. IMPRESSION: Soft tissue swelling. No evident fracture or arthropathy. Ankle mortise appears intact. Electronically Signed   By: Bretta BangWilliam  Woodruff III M.D.   On: 06/26/2020 14:24   VAS US LOWER EXTREMITY VENOUS (DVT) (ONLY [facility] & [facility])  Result Date: 06/26/2020  Lower Venous DVTStudy Indications: Pain, and Edema.  Risk Factors: Pregnancy. Performing Technologist: Jannet AskewVernon Matacale RCT RDMS  Examination Guidelines: A complete evaluation includes B-mode imaging, spectral Doppler, color Doppler, and power Doppler as needed of all accessible portions of each vessel. Bilateral testing is considered an integral part of a complete examination. Limited examinations for reoccurring indications may be performed as noted. The reflux  portion of the exam is performed with the patient in reverse Trendelenburg.  +-----+---------------+---------+-----------+----------+--------------+ RIGHTCompressibilityPhasicitySpontaneityPropertiesThrombus Aging +-----+---------------+---------+-----------+----------+--------------+ CFV  Full           Yes      Yes                                 +-----+---------------+---------+-----------+----------+--------------+ SFJ  Full                                                        +-----+---------------+---------+-----------+----------+--------------+   +---------+---------------+---------+-----------+----------+--------------+ LEFT     CompressibilityPhasicitySpontaneityPropertiesThrombus Aging +---------+---------------+---------+-----------+----------+--------------+ CFV      Full           Yes      Yes                                 +---------+---------------+---------+-----------+----------+--------------+ SFJ      Full                                                        +---------+---------------+---------+-----------+----------+--------------+ FV Prox  Full                                                        +---------+---------------+---------+-----------+----------+--------------+ FV Mid   Full                                                        +---------+---------------+---------+-----------+----------+--------------+ FV DistalFull                                                        +---------+---------------+---------+-----------+----------+--------------+ PFV      Full                                                        +---------+---------------+---------+-----------+----------+--------------+  POP      Full           Yes      Yes                                 +---------+---------------+---------+-----------+----------+--------------+ PTV      Full                                                         +---------+---------------+---------+-----------+----------+--------------+ PERO     Full                                                        +---------+---------------+---------+-----------+----------+--------------+     Summary: RIGHT: - No evidence of common femoral vein obstruction.  LEFT: - There is no evidence of deep vein thrombosis in the lower extremity.  - No cystic structure found in the popliteal fossa.  *See table(s) above for measurements and observations. Electronically signed by Fabienne Bruns MD on 06/26/2020 at 5:16:08 PM.    Final     Pertinent labs & imaging results that were available during my care of the patient were reviewed by me and considered in my medical decision making (see chart for details).  Medications Ordered in ED Medications  diphenhydrAMINE (BENADRYL) capsule 50 mg (50 mg Oral Given 06/26/20 1247)  oxyCODONE-acetaminophen (PERCOCET/ROXICET) 5-325 MG per tablet 2 tablet (2 tablets Oral Given 06/26/20 1247)  acetaminophen (TYLENOL) tablet 1,000 mg (1,000 mg Oral Given 06/27/20 0526)                                                                                                                                    Procedures Procedures  (including critical care time)  Medical Decision Making / ED Course I have reviewed the nursing notes for this encounter and the patient's prior records (if available in EHR or on provided paperwork).   Marsela Kuan was evaluated in Emergency Department on 06/27/2020 for the symptoms described in the history of present illness. She was evaluated in the context of the global COVID-19 pandemic, which necessitated consideration that the patient might be at risk for infection with the SARS-CoV-2 virus that causes COVID-19. Institutional protocols and algorithms that pertain to the evaluation of patients at risk for COVID-19 are in a state of rapid change based on information released by regulatory bodies including the CDC and  federal and state organizations. These policies and algorithms were followed during the patient's care in the ED.  DVT US and plain film negative. Labs  with mildly elevated inflammatory markers. Otherwise reassuring. Presentation is consistent with erythema nodosum. Tylenol and cold compress given.  Found sleeping comfortably on reassessment.      Final Clinical Impression(s) / ED Diagnoses Final diagnoses:  Erythema nodosum  [redacted] weeks gestation of pregnancy    The patient appears reasonably screened and/or stabilized for discharge and I doubt any other medical condition or other [facility] requiring further screening, evaluation, or treatment in the ED at this time prior to discharge. Safe for discharge with strict return precautions.  Disposition: Discharge  Condition: Good  I have discussed the results, Dx and Tx plan with the patient/family who expressed understanding and agree(s) with the plan. Discharge instructions discussed at length. The patient/family was given strict return precautions who verbalized understanding of the instructions. No further questions at time of discharge.    ED Discharge Orders    None       Follow Up: Obstetrician  Go on 06/29/2020 as scheduled     This chart was dictated using voice recognition software.  Despite best efforts to proofread,  errors can occur which can change the documentation meaning.   Nira Conn, MD 06/27/20 [phone]

## 2020-07-03 ENCOUNTER — Ambulatory Visit
Admission: RE | Admit: 2020-07-03 | Discharge: 2020-07-03 | Disposition: A | Discharge status: Home / Self Care | Payer: No Typology Code available for payment source | Source: Ambulatory Visit | Attending: Obstetrics and Gynecology | Admitting: Obstetrics and Gynecology

## 2020-07-03 ENCOUNTER — Other Ambulatory Visit: Payer: Self-pay

## 2020-07-03 ENCOUNTER — Other Ambulatory Visit: Payer: Self-pay | Admitting: Obstetrics and Gynecology

## 2020-07-03 DIAGNOSIS — N611 Abscess of the breast and nipple: Secondary | ICD-10-CM

## 2020-07-17 ENCOUNTER — Other Ambulatory Visit: Payer: Self-pay

## 2020-07-17 ENCOUNTER — Ambulatory Visit
Admission: RE | Admit: 2020-07-17 | Discharge: 2020-07-17 | Disposition: A | Discharge status: Home / Self Care | Payer: No Typology Code available for payment source | Source: Ambulatory Visit | Attending: Obstetrics and Gynecology | Admitting: Obstetrics and Gynecology

## 2020-07-17 DIAGNOSIS — N611 Abscess of the breast and nipple: Secondary | ICD-10-CM

## 2020-10-07 NOTE — L&D Delivery Note (Signed)
Delivery Note  Alicia Turner is a 25 y.o. female [phone] with IUP at [redacted]w[redacted]d admitted for Active labor and fetal heart rate decels.  She progressed without augmentation to complete and pushed on hands and knees to deliver.    At 1:07 AM a viable female was delivered via Vaginal, Spontaneous (Presentation: Right Occiput Anterior).  APGAR: 8, 9; weight  pending.    Placenta status: Spontaneous, Intact.  Cord: 3 vessels with the following complications: None.  Cord pH: n/a  Cord clamping delayed by several minutes then clamped by CNM and cut by patients mother.  Placenta intact and spontaneous, bleeding minimal.   Anesthesia: None Episiotomy: None Lacerations: None;1st degree, left labial Suture Repair: unrepaired Est. Blood Loss (mL): 100   Mom and baby stable prior to transfer to postpartum. She plans on breastfeeding. She is undecided for birth control.Baby to Couplet care / Skin to Skin.    Juliann Pares, Student-MidWife [employer] 11/18/2020, 1:38 AM

## 2020-11-09 LAB — OB RESULTS CONSOLE GBS: GBS: NEGATIVE

## 2020-11-17 ENCOUNTER — Encounter (HOSPITAL_COMMUNITY): Payer: Self-pay | Admitting: Obstetrics and Gynecology

## 2020-11-17 ENCOUNTER — Other Ambulatory Visit: Payer: Self-pay

## 2020-11-17 ENCOUNTER — Inpatient Hospital Stay (HOSPITAL_COMMUNITY)
Admission: AD | Admit: 2020-11-17 | Discharge: 2020-11-20 | DRG: 807 | Disposition: A | Discharge status: Home / Self Care | Payer: Medicaid Other | Attending: Obstetrics and Gynecology | Admitting: Obstetrics and Gynecology

## 2020-11-17 DIAGNOSIS — Z3A37 37 weeks gestation of pregnancy: Secondary | ICD-10-CM

## 2020-11-17 DIAGNOSIS — O368390 Maternal care for abnormalities of the fetal heart rate or rhythm, unspecified trimester, not applicable or unspecified: Secondary | ICD-10-CM

## 2020-11-17 DIAGNOSIS — Z20822 Contact with and (suspected) exposure to covid-19: Secondary | ICD-10-CM | POA: Diagnosis present

## 2020-11-17 DIAGNOSIS — O368330 Maternal care for abnormalities of the fetal heart rate or rhythm, third trimester, not applicable or unspecified: Secondary | ICD-10-CM | POA: Diagnosis not present

## 2020-11-17 DIAGNOSIS — Z8759 Personal history of other complications of pregnancy, childbirth and the puerperium: Secondary | ICD-10-CM | POA: Diagnosis not present

## 2020-11-17 HISTORY — DX: Maternal care for abnormalities of the fetal heart rate or rhythm, unspecified trimester, not applicable or unspecified: O36.8390

## 2020-11-17 LAB — CBC
HCT: 33.2 % — ABNORMAL LOW (ref 36.0–46.0)
Hemoglobin: 11.2 g/dL — ABNORMAL LOW (ref 12.0–15.0)
MCH: 29.1 pg (ref 26.0–34.0)
MCHC: 33.7 g/dL (ref 30.0–36.0)
MCV: 86.2 fL (ref 80.0–100.0)
Platelets: 235 10*3/uL (ref 150–400)
RBC: 3.85 MIL/uL — ABNORMAL LOW (ref 3.87–5.11)
RDW: 13.7 % (ref 11.5–15.5)
WBC: 10.1 10*3/uL (ref 4.0–10.5)
nRBC: 0 % (ref 0.0–0.2)

## 2020-11-17 LAB — TYPE AND SCREEN
ABO/RH(D): O POS
Antibody Screen: NEGATIVE

## 2020-11-17 MED ORDER — TERBUTALINE SULFATE 1 MG/ML IJ SOLN: 0.2500 mg | Freq: Once | INTRAMUSCULAR | Status: AC

## 2020-11-17 MED ORDER — LACTATED RINGERS IV SOLN
500.0000 mL | INTRAVENOUS | Status: DC | PRN
  Administered 2020-11-17: 999 mL via INTRAVENOUS

## 2020-11-17 MED ORDER — OXYTOCIN BOLUS FROM INFUSION
333.0000 mL | Freq: Once | INTRAVENOUS | Status: AC
  Administered 2020-11-18: 333 mL via INTRAVENOUS

## 2020-11-17 MED ORDER — SOD CITRATE-CITRIC ACID 500-334 MG/5ML PO SOLN: 30.0000 mL | ORAL | Status: DC | PRN

## 2020-11-17 MED ORDER — OXYCODONE-ACETAMINOPHEN 5-325 MG PO TABS
1.0000 | ORAL_TABLET | ORAL | Status: DC | PRN
  Administered 2020-11-18: 1 via ORAL
  Filled 2020-11-17: qty 1

## 2020-11-17 MED ORDER — ONDANSETRON HCL 4 MG/2ML IJ SOLN: 4.0000 mg | Freq: Four times a day (QID) | INTRAMUSCULAR | Status: DC | PRN

## 2020-11-17 MED ORDER — LIDOCAINE HCL (PF) 1 % IJ SOLN: 30.0000 mL | INTRAMUSCULAR | Status: DC | PRN

## 2020-11-17 MED ORDER — OXYTOCIN-SODIUM CHLORIDE 30-0.9 UT/500ML-% IV SOLN
2.5000 [IU]/h | INTRAVENOUS | Status: DC
Start: 2020-11-17 — End: 2020-11-18
  Filled 2020-11-17: qty 500

## 2020-11-17 MED ORDER — ACETAMINOPHEN 325 MG PO TABS: 650.0000 mg | ORAL_TABLET | ORAL | Status: DC | PRN

## 2020-11-17 MED ORDER — LACTATED RINGERS IV SOLN: INTRAVENOUS | Status: DC

## 2020-11-17 MED ORDER — TERBUTALINE SULFATE 1 MG/ML IJ SOLN
INTRAMUSCULAR | Status: AC
  Administered 2020-11-17: 0.25 mg via SUBCUTANEOUS
  Filled 2020-11-17: qty 1

## 2020-11-17 MED ORDER — OXYCODONE-ACETAMINOPHEN 5-325 MG PO TABS: 2.0000 | ORAL_TABLET | ORAL | Status: DC | PRN

## 2020-11-17 NOTE — H&P (Incomplete)
OBSTETRIC ADMISSION HISTORY AND PHYSICAL  Alicia Turner is a 25 y.o. female [phone] with IUP at [redacted]w[redacted]d by LMP presenting for Active labor and fetal heart rate decels. She reports +FMs, No LOF, no VB, no blurry vision, headaches or peripheral edema, and RUQ pain.  She plans on *** feeding. She is still deciding birth control, undecided about tubal. She received her prenatal care at [facility]   Dating: By LMP (02/28/20) --->  Estimated Date of Delivery: 12/06/20  Sono:    @[redacted]w[redacted]d , CWD, normal anatomy, cephalic presentation, posterior placenta lie, 307.8 g, 13.1% EFW   Prenatal History/Complications:   Past Medical History: Past Medical History:  Diagnosis Date  . Eczema   . Hyperthyroidism     Past Surgical History: Past Surgical History:  Procedure Laterality Date  . NO PAST SURGERIES      Obstetrical History: OB History    Gravida  4   Para  1   Term  1   Preterm  0   AB  2   Living  1     SAB  2   IAB      Ectopic      Multiple      Live Births  1           Social History Social History   Socioeconomic History  . Marital status: Single    Spouse name: Not on file  . Number of children: Not on file  . Years of education: Not on file  . Highest education level: Not on file  Occupational History  . Not on file  Tobacco Use  . Smoking status: Never Smoker  . Smokeless tobacco: Never Used  Vaping Use  . Vaping Use: Never used  Substance and Sexual Activity  . Alcohol use: Never  . Drug use: Never  . Sexual activity: Yes    Birth control/protection: None  Other Topics Concern  . Not on file  Social History Narrative  . Not on file   Social Determinants of Health   Financial Resource Strain: Not on file  Food Insecurity: Not on file  Transportation Needs: No Transportation Needs  . Lack of Transportation (Medical): No  . Lack of Transportation (Non-Medical): No  Physical Activity: Not on file  Stress: Not on file  Social Connections: Not on  file    Family History: Family History  Problem Relation Age of Onset  . Arthritis Mother        rheumatoid  . Hyperlipidemia Mother   . Healthy Father     Allergies: No Known Allergies  Medications Prior to Admission  Medication Sig Dispense Refill Last Dose  . Prenatal Vit-Fe Fumarate-FA (PRENATAL VITAMIN PO) Take by mouth.   11/17/2020 at Unknown time  . cyclobenzaprine (FLEXERIL) 10 MG tablet Take 1 tablet (10 mg total) by mouth 2 (two) times daily as needed for muscle spasms. 10 tablet 0      Review of Systems   All systems reviewed and negative except as stated in HPI  Blood pressure 113/77, pulse 70, temperature 99.1 F (37.3 C), temperature source Oral, resp. rate 18, height 5' 5.5" (1.664 m), weight 88.8 kg, last menstrual period 02/28/2020, SpO2 100 %. General appearance: alert and cooperative Lungs: clear to auscultation bilaterally Heart: regular rate and rhythm Abdomen: soft, non-tender; bowel sounds normal Pelvic: 4.5 checked by midwife Extremities: Homans sign is negative, no sign of DVT DTR's  Presentation: cephalic Fetal monitoringBaseline: 140 bpm, Variability: Good {> 6 bpm), Accelerations: Reactive  and Decelerations: Late, baby recovers well, may give turb. To give baby more recovery time. Uterine activityDate/time of onset: 11/17/20, Frequency: Every 2-3 minutes, Duration: 50-60 seconds and Intensity: moderate Dilation: 4.5 Effacement (%): 90 Station: 0 Exam by:: Leftwich-Kirby, CNM   Prenatal labs: ABO, Rh: --/--/O POS (02/11 2150) Antibody: NEG (02/11 2150) Rubella:  Immune RPR:   Non-reactive HBsAg:  Negative  HIV:   Negative GBS:   Neg 1 hr Glucola 90 Genetic screening  Neg Anatomy US Normal, boy  Prenatal Transfer Tool  Maternal Diabetes: No Genetic Screening: Normal Maternal Ultrasounds/Referrals: Normal Fetal Ultrasounds or other Referrals:  None Maternal Substance Abuse:  No Significant Maternal Medications:  None Significant  Maternal Lab Results: Group B Strep negative  Results for orders placed or performed during the hospital encounter of 11/17/20 (from the past 24 hour(s))  CBC   Collection Time: 11/17/20  9:50 PM  Result Value Ref Range   WBC 10.1 4.0 - 10.5 K/uL   RBC 3.85 (L) 3.87 - 5.11 MIL/uL   Hemoglobin 11.2 (L) 12.0 - 15.0 g/dL   HCT 57.3 (L) 22.0 - 25.4 %   MCV 86.2 80.0 - 100.0 fL   MCH 29.1 26.0 - 34.0 pg   MCHC 33.7 30.0 - 36.0 g/dL   RDW 27.0 62.3 - 76.2 %   Platelets 235 150 - 400 K/uL   nRBC 0.0 0.0 - 0.2 %  Type and screen [facility]   Collection Time: 11/17/20  9:50 PM  Result Value Ref Range   ABO/RH(D) O POS    Antibody Screen NEG    Sample Expiration      11/20/2020,2359 Performed at [facility], [address] [place], [place] [address]     Patient Active Problem List   Diagnosis Date Noted  . Non-reassuring fetal heart rate or rhythm affecting mother 11/17/2020    Assessment/Plan:  Alicia Turner is a 25 y.o. G4P1021 at [redacted]w[redacted]d here for Active labor with fetal heart rate decels.  #Labor: Active labor #Pain: Desires natural labor, coping well with contractions at this time Anticipate vaginal delivery  Anticipatory guidance regarding labor management  Juliann Pares, Student-MidWife  11/17/2020, 10:50 PM

## 2020-11-17 NOTE — H&P (Cosign Needed Addendum)
OBSTETRIC ADMISSION HISTORY AND PHYSICAL  Alicia Turner is a 25 y.o. female [phone] with IUP at [redacted]w[redacted]d by LMP presenting for Active labor and fetal heart rate decels. She reports +FMs, No LOF, no VB, no blurry vision, headaches or peripheral edema, and RUQ pain.  She is undecided on breastfeeding. She is still deciding birth control, undecided about tubal. She received her prenatal care at [facility]   Dating: By LMP (02/28/20) --->  Estimated Date of Delivery: 12/06/20  Sono:    @[redacted]w[redacted]d , CWD, normal anatomy, cephalic presentation, posterior placenta lie, 307.8 g, 13.1% EFW   Prenatal History/Complications:   Past Medical History: Past Medical History:  Diagnosis Date   Eczema    Hyperthyroidism     Past Surgical History: Past Surgical History:  Procedure Laterality Date   NO PAST SURGERIES      Obstetrical History: OB History     Gravida  4   Para  1   Term  1   Preterm  0   AB  2   Living  1      SAB  2   IAB      Ectopic      Multiple      Live Births  1           Social History Social History   Socioeconomic History   Marital status: Single    Spouse name: Not on file   Number of children: Not on file   Years of education: Not on file   Highest education level: Not on file  Occupational History   Not on file  Tobacco Use   Smoking status: Never Smoker   Smokeless tobacco: Never Used  Vaping Use   Vaping Use: Never used  Substance and Sexual Activity   Alcohol use: Never   Drug use: Never   Sexual activity: Yes    Birth control/protection: None  Other Topics Concern   Not on file  Social History Narrative   Not on file   Social Determinants of Health   Financial Resource Strain: Not on file  Food Insecurity: Not on file  Transportation Needs: No Transportation Needs   Lack of Transportation (Medical): No   Lack of Transportation (Non-Medical): No  Physical Activity: Not on file  Stress: Not on file  Social Connections: Not on file     Family History: Family History  Problem Relation Age of Onset   Arthritis Mother        rheumatoid   Hyperlipidemia Mother    Healthy Father     Allergies: No Known Allergies  Medications Prior to Admission  Medication Sig Dispense Refill Last Dose   Prenatal Vit-Fe Fumarate-FA (PRENATAL VITAMIN PO) Take by mouth.   11/17/2020 at Unknown time   cyclobenzaprine (FLEXERIL) 10 MG tablet Take 1 tablet (10 mg total) by mouth 2 (two) times daily as needed for muscle spasms. 10 tablet 0      Review of Systems   All systems reviewed and negative except as stated in HPI  Blood pressure 113/77, pulse 70, temperature 99.1 F (37.3 C), temperature source Oral, resp. rate 18, height 5' 5.5" (1.664 m), weight 88.8 kg, last menstrual period 02/28/2020, SpO2 100 %. General appearance: alert and cooperative Lungs: clear to auscultation bilaterally Heart: regular rate and rhythm Abdomen: soft, non-tender; bowel sounds normal Pelvic: 4.5 checked by midwife Extremities: Homans sign is negative, no sign of DVT DTR's  Presentation: cephalic Fetal monitoringBaseline: 140 bpm, Variability: Good {> 6 bpm),  Accelerations: Reactive and Decelerations: Late, baby recovers well, may give turb. To give baby more recovery time. Uterine activityDate/time of onset: 11/17/20, Frequency: Every 2-3 minutes, Duration: 50-60 seconds and Intensity: moderate Dilation: 4.5 Effacement (%): 90 Station: 0 Exam by:: Leftwich-Kirby, CNM   Prenatal labs: ABO, Rh: --/--/O POS (02/11 2150) Antibody: NEG (02/11 2150) Rubella:  Immune RPR:   Non-reactive HBsAg:  Negative  HIV:   Negative GBS:   Neg 1 hr Glucola 90 Genetic screening  Neg Anatomy US Normal, boy  Prenatal Transfer Tool  Maternal Diabetes: No Genetic Screening: Normal Maternal Ultrasounds/Referrals: Normal Fetal Ultrasounds or other Referrals:  None Maternal Substance Abuse:  No Significant Maternal Medications:  None Significant Maternal  Lab Results: Group B Strep negative  Results for orders placed or performed during the hospital encounter of 11/17/20 (from the past 24 hour(s))  CBC   Collection Time: 11/17/20  9:50 PM  Result Value Ref Range   WBC 10.1 4.0 - 10.5 K/uL   RBC 3.85 (L) 3.87 - 5.11 MIL/uL   Hemoglobin 11.2 (L) 12.0 - 15.0 g/dL   HCT 54.6 (L) 50.3 - 54.6 %   MCV 86.2 80.0 - 100.0 fL   MCH 29.1 26.0 - 34.0 pg   MCHC 33.7 30.0 - 36.0 g/dL   RDW 56.8 12.7 - 51.7 %   Platelets 235 150 - 400 K/uL   nRBC 0.0 0.0 - 0.2 %  Type and screen [facility]   Collection Time: 11/17/20  9:50 PM  Result Value Ref Range   ABO/RH(D) O POS    Antibody Screen NEG    Sample Expiration      11/20/2020,2359 Performed at [facility], [address], [place], [place] [address]     Patient Active Problem List   Diagnosis Date Noted   Non-reassuring fetal heart rate or rhythm affecting mother 11/17/2020    Assessment/Plan:  Alicia Turner is a 25 y.o. G4P1021 at [redacted]w[redacted]d here for Active labor with fetal heart rate decels.  #Labor: Active labor #Pain: Desires natural labor, coping well with contractions at this time Anticipate vaginal delivery  Anticipatory guidance regarding labor management Reviewed red flag symptoms and when to call Will continue to monitor contractions and recovery of baby FHT.  Juliann Pares, Student-MidWife  [employer] 11/17/2020, 10:50 PM  Midwife attestation: I have seen and examined this patient; I agree with above documentation in the midwife student's note.   Alicia Turner is a 24 y.o. [phone] here for active labor and fetal heart rate decelerations.  PE: BP 127/78 (BP Location: Right Arm)   Pulse 100   Temp 98.6 F (37 C) (Oral)   Resp 18   Ht 5' 5.5" (1.664 m)   Wt 195 lb 11.2 oz (88.8 kg)   LMP 02/28/2020 Comment: Gravida 3, Para 1, AB 1; edc by [place] end of July  SpO2 100%   BMI 32.07 kg/m  Gen: calm comfortable, NAD Resp: normal  effort, no distress Abd: gravid  ROS, labs, PMH reviewed  Plan: Admit to LD Labor: active labor Fetal monitoring: Category II ID: [id number]  Sharen Counter, CNM  11/18/2020, 6:52 AM

## 2020-11-17 NOTE — MAU Note (Signed)
Dr.Goswick called with patient presentation and to review fetal heart tracing. Orders received for admission.

## 2020-11-17 NOTE — MAU Note (Signed)
PT SAYS SHE FEELS UC . PNC WITH HD. VE AT 36 WEEKS -  CLOSED.  FEELS BABY MOVE.  DENIES HSV AND MRSA. GBS-  NEG

## 2020-11-17 NOTE — H&P (Incomplete)
Alicia Turner is a 25 y.o. female presenting for ***. OB History    Gravida  4   Para  1   Term  1   Preterm  0   AB  2   Living  1     SAB  2   IAB      Ectopic      Multiple      Live Births  1          Past Medical History:  Diagnosis Date  . Eczema   . Hyperthyroidism    Past Surgical History:  Procedure Laterality Date  . NO PAST SURGERIES     Family History: family history includes Arthritis in her mother; Healthy in her father; Hyperlipidemia in her mother. Social History:  reports that she has never smoked. She has never used smokeless tobacco. She reports that she does not drink alcohol and does not use drugs.     Maternal Diabetes: {Maternal Diabetes:3043596} Genetic Screening: {Genetic Screening:20205} Maternal Ultrasounds/Referrals: {Maternal Ultrasounds / Referrals:20211} Fetal Ultrasounds or other Referrals:  {Fetal Ultrasounds or Other Referrals:20213} Maternal Substance Abuse:  {Maternal Substance Abuse:20223} Significant Maternal Medications:  {Significant Maternal Meds:20233} Significant Maternal Lab Results:  {Significant Maternal Lab Results:20235} Other Comments:  {Other Comments:20251}  Review of Systems History Dilation: 4.5 Effacement (%): 90 Station: 0 Exam by:: Ginnie Smart RN Blood pressure 116/73, pulse 85, temperature 99.1 F (37.3 C), temperature source Oral, resp. rate 20, height 5' 5.5" (1.664 m), weight 88.8 kg, last menstrual period 02/28/2020. Exam Physical Exam  Prenatal labs: ABO, Rh:   Antibody:   Rubella:   RPR:    HBsAg:    HIV:    GBS:     Assessment/Plan: ***   Sharen Counter 11/17/2020, 9:59 PM

## 2020-11-18 DIAGNOSIS — Z3A37 37 weeks gestation of pregnancy: Secondary | ICD-10-CM

## 2020-11-18 LAB — RPR: RPR Ser Ql: NONREACTIVE

## 2020-11-18 LAB — RESP PANEL BY RT-PCR (FLU A&B, COVID) ARPGX2
Influenza A by PCR: NEGATIVE
Influenza B by PCR: NEGATIVE
SARS Coronavirus 2 by RT PCR: NEGATIVE

## 2020-11-18 LAB — CBC
HCT: 33.1 % — ABNORMAL LOW (ref 36.0–46.0)
Hemoglobin: 10.9 g/dL — ABNORMAL LOW (ref 12.0–15.0)
MCH: 28.3 pg (ref 26.0–34.0)
MCHC: 32.9 g/dL (ref 30.0–36.0)
MCV: 86 fL (ref 80.0–100.0)
Platelets: 214 10*3/uL (ref 150–400)
RBC: 3.85 MIL/uL — ABNORMAL LOW (ref 3.87–5.11)
RDW: 13.6 % (ref 11.5–15.5)
WBC: 13.4 10*3/uL — ABNORMAL HIGH (ref 4.0–10.5)
nRBC: 0 % (ref 0.0–0.2)

## 2020-11-18 MED ORDER — WITCH HAZEL-GLYCERIN EX PADS: 1.0000 "application " | MEDICATED_PAD | CUTANEOUS | Status: DC | PRN

## 2020-11-18 MED ORDER — DIPHENHYDRAMINE HCL 25 MG PO CAPS: 25.0000 mg | ORAL_CAPSULE | Freq: Four times a day (QID) | ORAL | Status: DC | PRN

## 2020-11-18 MED ORDER — COCONUT OIL OIL
1.0000 "application " | TOPICAL_OIL | Status: DC | PRN
  Administered 2020-11-19: 1 via TOPICAL

## 2020-11-18 MED ORDER — ACETAMINOPHEN 325 MG PO TABS: 650.0000 mg | ORAL_TABLET | ORAL | Status: DC | PRN

## 2020-11-18 MED ORDER — BENZOCAINE-MENTHOL 20-0.5 % EX AERO: 1.0000 "application " | INHALATION_SPRAY | CUTANEOUS | Status: DC | PRN

## 2020-11-18 MED ORDER — ONDANSETRON HCL 4 MG PO TABS: 4.0000 mg | ORAL_TABLET | ORAL | Status: DC | PRN

## 2020-11-18 MED ORDER — SENNOSIDES-DOCUSATE SODIUM 8.6-50 MG PO TABS
2.0000 | ORAL_TABLET | Freq: Every day | ORAL | Status: DC
  Administered 2020-11-19 – 2020-11-20 (×2): 2 via ORAL
  Filled 2020-11-18 (×2): qty 2

## 2020-11-18 MED ORDER — IBUPROFEN 600 MG PO TABS
600.0000 mg | ORAL_TABLET | Freq: Four times a day (QID) | ORAL | Status: DC
  Administered 2020-11-18 – 2020-11-20 (×9): 600 mg via ORAL
  Filled 2020-11-18 (×10): qty 1

## 2020-11-18 MED ORDER — PRENATAL MULTIVITAMIN CH
1.0000 | ORAL_TABLET | Freq: Every day | ORAL | Status: DC
  Administered 2020-11-18 – 2020-11-20 (×3): 1 via ORAL
  Filled 2020-11-18 (×3): qty 1

## 2020-11-18 MED ORDER — TETANUS-DIPHTH-ACELL PERTUSSIS 5-2.5-18.5 LF-MCG/0.5 IM SUSY: 0.5000 mL | PREFILLED_SYRINGE | Freq: Once | INTRAMUSCULAR | Status: DC

## 2020-11-18 MED ORDER — DIBUCAINE (PERIANAL) 1 % EX OINT: 1.0000 "application " | TOPICAL_OINTMENT | CUTANEOUS | Status: DC | PRN

## 2020-11-18 MED ORDER — SIMETHICONE 80 MG PO CHEW: 80.0000 mg | CHEWABLE_TABLET | ORAL | Status: DC | PRN

## 2020-11-18 MED ORDER — OXYCODONE HCL 5 MG PO TABS: 10.0000 mg | ORAL_TABLET | ORAL | Status: DC | PRN

## 2020-11-18 MED ORDER — ZOLPIDEM TARTRATE 5 MG PO TABS: 5.0000 mg | ORAL_TABLET | Freq: Every evening | ORAL | Status: DC | PRN

## 2020-11-18 MED ORDER — OXYCODONE HCL 5 MG PO TABS: 5.0000 mg | ORAL_TABLET | ORAL | Status: DC | PRN

## 2020-11-18 MED ORDER — ONDANSETRON HCL 4 MG/2ML IJ SOLN: 4.0000 mg | INTRAMUSCULAR | Status: DC | PRN

## 2020-11-18 NOTE — Progress Notes (Signed)
CSW received consult for hx of marijuana use.  Referral was screened out due to the following: ~MOB had no documented substance use after initial prenatal visit/+UPT. ~MOB had no positive drug screens after initial prenatal visit/+UPT. ~Baby's UDS is negative.  CSW will monitor CDS results and make report to Child Protective Services if warranted.  MOB was referred for history of depression/anxiety. * Referral screened out by Clinical Social Worker because none of the following criteria appear to apply: ~ History of anxiety/depression during this pregnancy, or of post-partum depression following prior delivery. CSW as unable to verify hx of anxiety. There were no concerns noted in OB records.  ~ Diagnosis of anxiety and/or depression within last 3 years OR * MOB's symptoms currently being treated with medication and/or therapy.  Please contact the Clinical Social Worker if needs arise, by [facility] request, or if MOB scores greater than 9/yes to question 10 on Edinburgh Postpartum Depression Screen.  There are no barriers to discharge.  Blaine Hamper, MSW, LCSW Clinical Social Work [phone]

## 2020-11-18 NOTE — Discharge Summary (Addendum)
Postpartum Discharge Summary    Patient Name: Alicia Turner DOB: 01/24/1996 MRN: [id number]  Date of admission: 11/17/2020 Delivery date:11/18/2020  Delivering provider: Fatima Blank A  Date of discharge: 11/20/2020  Admitting diagnosis: Non-reassuring fetal heart rate or rhythm affecting mother [O36.8390] Intrauterine pregnancy: [redacted]w[redacted]d    Secondary diagnosis:  Active Problems:   Non-reassuring fetal heart rate or rhythm affecting mother  Additional problems:     Discharge diagnosis: Term Pregnancy Delivered                                          Post partum procedures:none Augmentation: AROM Complications: None  Hospital course: Onset of Labor With Vaginal Delivery      25y.o. yo [id number] admitted in Active Labor on 11/17/2020. Patient had an uncomplicated labor course as follows:  Membrane Rupture Time/Date: 12:33 AM ,11/18/2020   Delivery Method:Vaginal, Spontaneous  Episiotomy: None  Lacerations:  None;1st degree  Patient had an uncomplicated postpartum course.  She is ambulating, tolerating a regular diet, passing flatus, and urinating well. Patient is discharged home in stable condition on 11/20/20.  Newborn Data: Birth date:11/18/2020  Birth time:1:07 AM  Gender:Female  Living status:Living  Apgars:8 ,9  Weight:3164 g   Magnesium Sulfate received: No BMZ received: No Rhophylac:No MMR:No T-DaP:Given prenatally Flu: No Transfusion:No  Physical exam  Vitals:   11/18/20 2200 11/19/20 0518 11/19/20 2342 11/20/20 0740  BP:  135/88 115/78 124/75  Pulse: 83 82 88 81  Resp: '18 18 18 18  ' Temp: 98.7 F (37.1 C) 98 F (36.7 C) 98.5 F (36.9 C) 98.9 F (37.2 C)  TempSrc: Oral Oral Oral Oral  SpO2: 100% 100% 100% 100%  Weight:      Height:       General: alert, cooperative and no distress Lochia: appropriate Uterine Fundus: firm Incision: N/A DVT Evaluation: No evidence of DVT seen on physical exam. No significant calf/ankle edema. Labs: Lab  Results  Component Value Date   WBC 13.4 (H) 11/18/2020   HGB 10.9 (L) 11/18/2020   HCT 33.1 (L) 11/18/2020   MCV 86.0 11/18/2020   PLT 214 11/18/2020   CMP Latest Ref Rng & Units 06/27/2020  Glucose 70 - 99 mg/dL 103(H)  BUN 6 - 20 mg/dL 6  Creatinine 0.44 - 1.00 mg/dL 0.50  Sodium 135 - 145 mmol/L 137  Potassium 3.5 - 5.1 mmol/L 3.5  Chloride 98 - 111 mmol/L 99   Edinburgh Score: Edinburgh Postnatal Depression Scale Screening Tool 11/19/2020  I have been able to laugh and see the funny side of things. 0  I have looked forward with enjoyment to things. 0  I have blamed myself unnecessarily when things went wrong. 1  I have been anxious or worried for no good reason. 0  I have felt scared or panicky for no good reason. 0  Things have been getting on top of me. 0  I have been so unhappy that I have had difficulty sleeping. 0  I have felt sad or miserable. 1  I have been so unhappy that I have been crying. 0  The thought of harming myself has occurred to me. 0  Edinburgh Postnatal Depression Scale Total 2     After visit meds:  Allergies as of 11/20/2020   No Known Allergies     Medication List    STOP taking these medications  cyclobenzaprine 10 MG tablet Commonly known as: FLEXERIL     TAKE these medications   acetaminophen 325 MG tablet Commonly known as: Tylenol Take 2 tablets (650 mg total) by mouth every 4 (four) hours as needed (for pain scale < 4).   amLODipine 5 MG tablet Commonly known as: NORVASC Take 1 tablet (5 mg total) by mouth daily. Start taking on: November 21, 2020   ibuprofen 600 MG tablet Commonly known as: ADVIL Take 1 tablet (600 mg total) by mouth every 6 (six) hours.   polyethylene glycol powder 17 GM/SCOOP powder Commonly known as: GLYCOLAX/MIRALAX Take 17 g by mouth daily as needed.   PRENATAL VITAMIN PO Take 1 tablet by mouth daily.        Discharge home in stable condition Infant Feeding: Bottle and Breast Infant  Disposition:NICU Discharge instruction: per After Visit Summary and Postpartum booklet. Activity: Advance as tolerated. Pelvic rest for 6 weeks.  Diet: routine diet Future Appointments:No future appointments. Follow up Visit:  Pt of [facility] Please schedule this patient for a In person postpartum visit in 4 weeks with the following provider: Any provider. Additional Postpartum F/U:n/a  Low risk pregnancy complicated by: n/a Delivery mode:  Vaginal, Spontaneous  Anticipated Birth Control:  Depo prior to discharge, considering Nexplanon at follow up   11/20/2020 Clarnce Flock, MD

## 2020-11-18 NOTE — Lactation Note (Signed)
This note was copied from a baby's chart. Lactation Consultation Note  Patient Name: Alicia Turner ZRAQT'M Date: 11/18/2020 Reason for consult: L&D Initial assessment Age:26 hours  L&D consult with 44 minutes old infant and P1 mother. Mother and grandmother are present at time of consult. Congratulated them on their newborn. Infant is skin to skin prone on mother's chest. Discussed STS as ideal transition for infants after birth helping with temperature, blood sugar and comfort. Talked about primal reflexes such as rooting, hands to mouth, searching for the breast among others.   LC assisted with hand expression at this time, assisted with positioning for latch but infant is to sleepy. Explained LC services availability during postpartum stay. Thanked family for their time.    Maternal Data Does the patient have breastfeeding experience prior to this delivery?: No  Feeding Mother's Current Feeding Choice: Breast Milk  Interventions Interventions: Skin to skin;Hand express;Breast feeding basics reviewed;Education  Consult Status Consult Status: Follow-up Date: 11/18/20 Follow-up type: In-patient    [name] [name] 11/18/2020, 1:50 AM

## 2020-11-19 MED ORDER — AMLODIPINE BESYLATE 5 MG PO TABS
5.0000 mg | ORAL_TABLET | Freq: Every day | ORAL | Status: DC
  Administered 2020-11-19 – 2020-11-20 (×2): 5 mg via ORAL
  Filled 2020-11-19 (×2): qty 1

## 2020-11-19 NOTE — Progress Notes (Signed)
Patient is in NICU Patients mom is in the room called patient and informed her that she needed to come down to mother baby if she needed her pain medication Patient states that she does not want the medication at 0000 midnight.

## 2020-11-19 NOTE — Lactation Note (Signed)
This note was copied from a baby's chart. Lactation Consultation Note  Patient Name: Alicia Turner VEHMC'N Date: 11/19/2020 Reason for consult: NICU baby;Early term 37-38.6wks;Hyperbilirubinemia Age:26 hours  Follow up visit to P2 mother of 34 hours old ETI currently in NICU. Mother states just started pumping  and only pumping session, so far, she collected ~26mL.  Discussed the importance of pumping 8-12 times in 24 h including night time. Mother is latching infant as possible and infant seems to have an "aggressive" latch.  Infant is currently receiving phototherapy.  Plan: 1-Pump using maintenance setting 8-12 times x 24h for breast stimulation 2-Promoted maternal self care 3-Contact LC as needed for feeds/support/concerns/questions  All questions answered at this time.   Maternal Data Has patient been taught Hand Expression?: Yes Does the patient have breastfeeding experience prior to this delivery?: Yes  Feeding Mother's Current Feeding Choice: Breast Milk  LATCH Score Latch: Grasps breast easily, tongue down, lips flanged, rhythmical sucking.  Audible Swallowing: Spontaneous and intermittent  Type of Nipple: Everted at rest and after stimulation  Comfort (Breast/Nipple): Soft / non-tender  Hold (Positioning): No assistance needed to correctly position infant at breast.  LATCH Score: 10   Lactation Tools Discussed/Used Tools: Pump Breast pump type: Double-Electric Breast Pump Reason for Pumping: maternal infant separation Pumping frequency: 8-12 times in 24h Pumped volume: 20 mL (pumping session @ 8am)  Interventions Interventions: DEBP;Breast feeding basics reviewed;Expressed milk  Discharge    Consult Status Consult Status: Follow-up Date: 11/20/20 Follow-up type: In-patient    Mysty Kielty [name] 11/19/2020, 11:29 AM

## 2020-11-19 NOTE — Lactation Note (Signed)
This note was copied from a baby's chart. Lactation Consultation Note  Patient Name: Alicia Turner YFVCB'S Date: 11/19/2020   Age:26 hours  attempted to see.  Parents in nicu.  Maternal Data    Feeding    LATCH Score Latch: Grasps breast easily, tongue down, lips flanged, rhythmical sucking.  Audible Swallowing: Spontaneous and intermittent  Type of Nipple: Everted at rest and after stimulation  Comfort (Breast/Nipple): Soft / non-tender  Hold (Positioning): No assistance needed to correctly position infant at breast.  LATCH Score: 10   Lactation Tools Discussed/Used    Interventions    Discharge    Consult Status      Neomia Dear 11/19/2020, 9:17 AM

## 2020-11-19 NOTE — Progress Notes (Signed)
Post Partum Day 1 Subjective: no complaints, up ad lib, voiding and tolerating PO  Objective: Blood pressure 135/88, pulse 82, temperature 98 F (36.7 C), temperature source Oral, resp. rate 18, height 5' 5.5" (1.664 m), weight 88.8 kg, last menstrual period 02/28/2020, SpO2 100 %.  Physical Exam:  General: alert, cooperative, appears stated age, fatigued and no distress Lochia: appropriate Uterine Fundus: firm Incision: NA DVT Evaluation: No evidence of DVT seen on physical exam.  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 Offered pt D/C today (since mom wants to stay in NICU with bay as much as possible) vs D/C tomorrow. Wants D/C tomorrow. Will try to get pt roomed in w/ baby in Couplet Care.  BP borderline this AM. Norvasc 5 started.    LOS: 2 days   [place] 11/19/2020, 3:16 PM

## 2020-11-20 ENCOUNTER — Other Ambulatory Visit: Payer: Self-pay | Admitting: Family Medicine

## 2020-11-20 ENCOUNTER — Encounter (HOSPITAL_COMMUNITY): Payer: Self-pay

## 2020-11-20 DIAGNOSIS — O368330 Maternal care for abnormalities of the fetal heart rate or rhythm, third trimester, not applicable or unspecified: Secondary | ICD-10-CM

## 2020-11-20 DIAGNOSIS — Z8759 Personal history of other complications of pregnancy, childbirth and the puerperium: Secondary | ICD-10-CM

## 2020-11-20 DIAGNOSIS — Z3A37 37 weeks gestation of pregnancy: Secondary | ICD-10-CM

## 2020-11-20 MED ORDER — ACETAMINOPHEN 325 MG PO TABS: 650.0000 mg | ORAL_TABLET | ORAL | 0 refills | Status: DC | PRN

## 2020-11-20 MED ORDER — POLYETHYLENE GLYCOL 3350 17 GM/SCOOP PO POWD: 17.0000 g | Freq: Every day | ORAL | 1 refills | Status: DC | PRN

## 2020-11-20 MED ORDER — IBUPROFEN 600 MG PO TABS: 600.0000 mg | ORAL_TABLET | Freq: Four times a day (QID) | ORAL | 0 refills | Status: DC

## 2020-11-20 MED ORDER — MEDROXYPROGESTERONE ACETATE 150 MG/ML IM SUSP
150.0000 mg | Freq: Once | INTRAMUSCULAR | Status: AC
  Administered 2020-11-20: 150 mg via INTRAMUSCULAR
  Filled 2020-11-20: qty 1

## 2020-11-20 MED ORDER — AMLODIPINE BESYLATE 5 MG PO TABS: 5.0000 mg | ORAL_TABLET | Freq: Every day | ORAL | 2 refills | Status: DC

## 2020-11-20 MED FILL — ACETAMINOPHEN 325 MG TABS: 325 | 7 days supply | Qty: 30 | Fill #0

## 2020-11-20 MED FILL — POLYETHYLENE GLYCOL 3350 PO: 17 | 30 days supply | Qty: 510 | Fill #0

## 2020-11-20 MED FILL — AMLODIPINE BESYLATE 5 MG TA: 5 | 30 days supply | Qty: 30 | Fill #0

## 2020-11-20 MED FILL — IBUPROFEN 600 MG TABLET: 600 | 10 days supply | Qty: 30 | Fill #0

## 2020-11-20 NOTE — Clinical Social Work Maternal (Signed)
CLINICAL SOCIAL WORK MATERNAL/CHILD NOTE  Patient Details  Name: Alicia Turner MRN: [id number] Date of Birth: 08/17/1996  Date:  11/20/2020  Clinical Social Worker Initiating Note:  Emiliana Blaize, LCSW Date/Time: Initiated:  11/20/20/1251     Child's Name:  Alicia Turner   Biological Parents:  Mother,Father (Father: Kenyatte Morris)   Need for Interpreter:  None   Reason for Referral:  Behavioral Health Concerns,Other (Comment),Current Substance Use/Substance Use During Pregnancy  (NICU admission)   Address:  [address] [place] [address]    Phone number:  [phone] (home)     Additional phone number:   Household Members/Support Persons (HM/SP):   Household Member/Support Person 1,Household Member/Support Person 2,Household Member/Support Person 3   HM/SP Name Relationship DOB or Age  HM/SP -1   mom    HM/SP -2   dad    HM/SP -3 Zymir Vandenbosch son 12/20/17  HM/SP -4        HM/SP -5        HM/SP -6        HM/SP -7        HM/SP -8          Natural Supports (not living in the home):  Spouse/significant other   Professional Supports: None   Employment: Unemployed   Type of Work:     Education:  High school graduate   Homebound arranged:    Financial Resources:  Medicaid   Other Resources:      Cultural/Religious Considerations Which May Impact Care:    Strengths:  Home prepared for child ,Understanding of illness,Ability to meet basic needs    Psychotropic Medications:         Pediatrician:       Pediatrician List:   [place]    [place]    [place]    [place]    [place]    [place]      Pediatrician Fax Number:    Risk Factors/Current Problems:  Substance Use ,Mental Health Concerns    Cognitive State:  Able to Concentrate ,Alert ,Insightful ,Goal Oriented ,Linear Thinking    Mood/Affect:  Calm ,Interested ,Comfortable    CSW Assessment: CSW met with MOB at infant's bedside to  discuss infant's NICU admission, substance use during pregnancy and behavioral health concerns. CSW introduced self and explained reason for consult. MOB was welcoming, pleasant, open and remained engaged during assessment. MOB reported that she resides with her parents and 2 year old son. MOB reported that she plans to move back to [place] eventually, noting that she has been in [place] since August. MOB reported that she has all items needed to care for infant including a car seat and basinet. CSW inquired about MOB's support system, MOB reported that FOB in [place] and her mom are supports.   CSW inquired about MOB's mental health history. MOB initially denied any mental health history. CSW inquired about anxiety and postpartum depression noted in infant's chart. MOB denied postpartum depression and endorsed a history of anxiety. MOB reported that she has never been formally diagnosed with anxiety. MOB reported that she experienced some anxiety surrounding infant being admitted to the NICU. MOB reported that this is a new experience for her. CSW acknowledged and validated MOB's experience/feelings. MOB reported that her anxiety is under control and situational. MOB denied taking any medication or participating in therapy treat anxiety. CSW inquired about how MOB was feeling emotionally after giving birth, MOB reported that   she was feeling a little stressed because of infant's NICU admission but excited for the most part. MOB presented calm and did not demonstrate any acute mental health signs/symptoms. CSW assessed for safety, MOB denied SI, HI and domestic violence.   CSW provided education regarding the baby blues period vs. perinatal mood disorders, discussed treatment and gave resources for mental health follow up if concerns arise.  CSW recommends self-evaluation during the postpartum time period using the New Mom Checklist from Postpartum Progress and encouraged MOB to contact a medical  professional if symptoms are noted at any time.    CSW provided review of Sudden Infant Death Syndrome (SIDS) precautions.    CSW and MOB discussed infant's NICU admission. CSW informed MOB about the NICU, what to expect and resources/supports available while infant is admitted to the NICU. MOB reported that she feels well informed about infant's care. MOB reported that meal vouchers would be helpful, CSW provided 4 meal vouchers. MOB denied any transportation barriers with visiting infant in the NICU. MOB denied any questions/concerns regarding the NICU. CSW informed MOB about the hospital drug screen policy due to substance during pregnancy. MOB confirmed marijuana use and reported last use as the beginning of July. MOB denied any additional substance use. CSW informed MOB that infant's UDS was negative and CDS would continue to be monitored and a CPS report would be made if warranted. MOB verbalized understanding and denied any CPS history.   CSW will continue to offer resources/supports while infant is admitted to the NICU.   CSW Plan/Description:  Psychosocial Support and Ongoing Assessment of Needs,Sudden Infant Death Syndrome (SIDS) Education,Perinatal Mood and Anxiety Disorder (PMADs) Education,Hospital Drug Screen Policy Information,CSW Will Continue to Monitor Umbilical Cord Tissue Drug Screen Results and Make Report if Warranted,Other Patient/Family Education    Arkeem Harts L Sherlie Boyum, LCSW 11/20/2020, 1:34 PM  

## 2020-11-20 NOTE — Progress Notes (Signed)
Discharge rounding attempted. Patient in NICU  Clayton Bibles, MSN, CNM Certified Nurse Midwife, [facility] for [employer], [facility] 11/20/20 11:58 AM

## 2020-11-20 NOTE — Discharge Instructions (Signed)
Postpartum Care After Vaginal Delivery The following information offers guidance about how to care for yourself from the time you deliver your baby to 6-12 weeks after delivery (postpartum period). If you have problems or questions, contact your health care provider for more specific instructions. Follow these instructions at home: Vaginal bleeding  It is normal to have vaginal bleeding (lochia) after delivery. Wear a sanitary pad for bleeding and discharge. ? During the first week after delivery, the amount and appearance of lochia is often similar to a menstrual period. ? Over the next few weeks, it will gradually decrease to a dry, yellow-brown discharge. ? For most women, lochia stops completely by 4-6 weeks after delivery, but can vary.  Change your sanitary pads frequently. Watch for any changes in your flow, such as: ? A sudden increase in volume. ? A change in color. ? Large blood clots.  If you pass a blood clot from your vagina, save it and call your health care provider. Do not flush blood clots down the toilet before talking with your health care provider.  Do not use tampons or douches until your health care provider approves.  If you are not breastfeeding, your period should return 6-8 weeks after delivery. If you are feeding your baby breast milk only, your period may not return until you stop breastfeeding. Perineal care  Keep the area between the vagina and the anus (perineum) clean and dry. Use medicated pads and pain-relieving sprays and creams as directed.  If you had a surgical cut in the perineum (episiotomy) or a tear, check the area for signs of infection until you are healed. Check for: ? More redness, swelling, or pain. ? Fluid or blood coming from the cut or tear. ? Warmth. ? Pus or a bad smell.  You may be given a squirt bottle to use instead of wiping to clean the perineum area after you use the bathroom. Pat the area gently to dry it.  To relieve pain  caused by an episiotomy, a tear, or swollen veins in the anus (hemorrhoids), take a warm sitz bath 2-3 times a day. In a sitz bath, the warm water should only come up to your hips and cover your buttocks.   Breast care  In the first few days after delivery, your breasts may feel heavy, full, and uncomfortable (breast engorgement). Milk may also leak from your breasts. Ask your health care provider about ways to help relieve the discomfort.  If you are breastfeeding: ? Wear a bra that supports your breasts and fits well. Use breast pads to absorb milk that leaks. ? Keep your nipples clean and dry. Apply creams and ointments as told. ? You may have uterine contractions every time you breastfeed for up to several weeks after delivery. This helps your uterus return to its normal size. ? If you have any problems with breastfeeding, notify your health care provider or lactation consultant.  If you are not breastfeeding: ? Avoid touching your breasts. Do not squeeze out (express) milk. Doing this can make your breasts produce more milk. ? Wear a good-fitting bra and use cold packs to help with swelling. Intimacy and sexuality  Ask your health care provider when you can engage in sexual activity. This may depend upon: ? Your risk of infection. ? How fast you are healing. ? Your comfort and desire to engage in sexual activity.  You are able to get pregnant after delivery, even if you have not had your period. Talk with   your health care provider about methods of birth control (contraception) or family planning if you desire future pregnancies. Medicines  Take over-the-counter and prescription medicines only as told by your health care provider.  Take an over-the-counter stool softener to help ease bowel movements as told by your health care provider.  If you were prescribed an antibiotic medicine, take it as told by your health care provider. Do not stop taking the antibiotic even if you start to  feel better.  Review all previous and current prescriptions to check for possible transfer into breast milk. Activity  Gradually return to your normal activities as told by your health care provider.  Rest as much as possible. Nap while your baby is sleeping. Eating and drinking  Drink enough fluid to keep your urine pale yellow.  To help prevent or relieve constipation, eat high-fiber foods every day.  Choose healthy eating to support breastfeeding or weight loss goals.  Take your prenatal vitamins until your health care provider tells you to stop.   General tips/recommendations  Do not use any products that contain nicotine or tobacco. These products include cigarettes, chewing tobacco, and vaping devices, such as e-cigarettes. If you need help quitting, ask your health care provider.  Do not drink alcohol, especially if you are breastfeeding.  Do not take medications or drugs that are not prescribed to you, especially if you are breastfeeding.  Visit your health care provider for a postpartum checkup within the first 3-6 weeks after delivery.  Complete a comprehensive postpartum visit no later than 12 weeks after delivery.  Keep all follow-up visits for you and your baby. Contact a health care provider if:  You feel unusually sad or worried.  Your breasts become red, painful, or hard.  You have a fever or other signs of an infection.  You have bleeding that is soaking through one pad an hour or you have blood clots.  You have a severe headache that doesn't go away or you have vision changes.  You have nausea and vomiting and are unable to eat or drink anything for 24 hours. Get help right away if:  You have chest pain or difficulty breathing.  You have sudden, severe leg pain.  You faint or have a seizure.  You have thoughts about hurting yourself or your baby. If you ever feel like you may hurt yourself or others, or have thoughts about taking your own life,  get help right away. Go to your nearest emergency department or:  Call your local emergency services (911 in the [place]  The National Suicide Prevention Lifeline at 3643785264. This suicide crisis helpline is open 24 hours a day.  Text the Crisis Text Line at (931)277-0718 (in the [place] Summary  The period of time after you deliver your newborn up to 6-12 weeks after delivery is called the postpartum period.  Keep all follow-up visits for you and your baby.  Review all previous and current prescriptions to check for possible transfer into breast milk.  Contact a health care provider if you feel unusually sad or worried during the postpartum period. This information is not intended to replace advice given to you by your health care provider. Make sure you discuss any questions you have with your health care provider. Document Revised: 06/08/2020 Document Reviewed: 06/08/2020 Elsevier Patient Education  2021 Elsevier Inc.   Contraception Choices Contraception refers to things you do or use to prevent pregnancy. It is also called birth control. There are several methods of birth  control. Talk to your doctor about the best method for you. Hormonal birth control This kind of birth control uses hormones. Here are some types of hormonal birth control:  A tube that is put under the skin of your arm (implant). The tube can stay in for up to 3 years.  Shots you get every 3 months.  Pills you take every day.  A patch you change 1 time each week for 3 weeks. After that, the patch is taken off for 1 week.  A ring you put in the vagina. The ring is left in for 3 weeks. Then it is taken out of the vagina for 1 week. Then a new ring is put in.  Pills you take after unprotected sex. These are called emergency birth control pills.   Barrier birth control Here are some types of barrier birth control:  A thin covering that is put on the penis before sex (female condom). The covering is thrown away  after sex.  A soft, loose covering that is put in the vagina before sex (female condom). The covering is thrown away after sex.  A rubber bowl that sits over the cervix (diaphragm). The bowl must be made for you. The bowl is put into the vagina before sex. The bowl is left in for 6-8 hours after sex. It is taken out within 24 hours.  A small, soft cup that fits over the cervix (cervical cap). The cup must be made for you. The cup should be left in for 6-8 hours after sex. It is taken out within 48 hours.  A sponge that is put into the vagina before sex. It must be left in for at least 6 hours after sex. It must be taken out within 30 hours and thrown away.  A chemical that kills or stops sperm from getting into the womb (uterus). This chemical is called a spermicide. It may be a pill, cream, jelly, or foam to put in the vagina. The chemical should be used at least 10-15 minutes before sex.   IUD birth control IUD means "intrauterine device." It is put inside the womb. There are two kinds:  Hormone IUD. This kind can stay in the womb for 3-5 years.  Copper IUD. This kind can stay in the womb for 10 years. Permanent birth control Here are some types of permanent birth control:  Surgery to block the fallopian tubes.  Having an insert put into each fallopian tube. This method takes 3 months to work. Other forms of birth control must be used for 3 months.  Surgery to tie off the tubes that carry sperm in men (vasectomy). This method takes 3 months to work. Other forms of birth control must be used for 3 months. Natural planning birth control Here are some types of natural planning birth control:  Not having sex on the days the woman could get pregnant.  Using a calendar: ? To keep track of the length of each menstrual cycle. ? To find out what days pregnancy can happen. ? To plan to not have sex on days when pregnancy can happen.  Watching for signs of ovulation and not having sex  during this time. One way the woman can check for ovulation is to check her temperature.  Waiting to have sex until after ovulation. Where to find more information  Centers for Disease Control and Prevention: [employer] Summary  Contraception, also called birth control, refers to things you do or use to prevent pregnancy.  Hormonal methods of birth control include implants, injections, pills, patches, vaginal rings, and emergency birth control pills.  Barrier methods of birth control can include female condoms, female condoms, diaphragms, cervical caps, sponges, and spermicides.  There are two types of IUD (intrauterine device) birth control. An IUD can be put in a woman's womb to prevent pregnancy for several years.  Permanent birth control can be done through a procedure for males, females, or both. Natural planning means not having sex when the woman could get pregnant. This information is not intended to replace advice given to you by your health care provider. Make sure you discuss any questions you have with your health care provider. Document Revised: 02/28/2020 Document Reviewed: 02/28/2020 Elsevier Patient Education  2021 [employer].

## 2020-11-22 ENCOUNTER — Ambulatory Visit: Payer: Self-pay

## 2020-11-22 NOTE — Lactation Note (Signed)
This note was copied from a baby's chart. Lactation Consultation Note  Patient Name: Boy Ronisha Herringshaw ONGEX'B Date: 11/22/2020 Reason for consult: Follow-up assessment;NICU baby;Early term 37-38.6wks Age:26 days   1620 - 1622 - Lactation followed up with Ms. Yount. She will be discharged today or tomorrow. She has not yet chosen a pediatrician. I recommended that she follow up with her pediatrician within 48 hours of discharge.  Ms. Lindo was pumping the right breast upon entry. She states that baby latched to the left breast prior to entry. She states that he is breast feeding and bottle feeding her EBM.  Consult was discontinued with team entered to circumcise baby "Zayvian." More discharge education and review of feeding plan is indicated.   Feeding Mother's Current Feeding Choice: Breast Milk  LATCH Score Latch: Grasps breast easily, tongue down, lips flanged, rhythmical sucking.  Audible Swallowing: Spontaneous and intermittent  Type of Nipple: Everted at rest and after stimulation  Comfort (Breast/Nipple): Soft / non-tender  Hold (Positioning): No assistance needed to correctly position infant at breast.  LATCH Score: 10   Lactation Tools Discussed/Used    Interventions Interventions: Education  Discharge    Consult Status Consult Status: Follow-up Date: 11/23/20 Follow-up type: In-patient    Walker Shadow 11/22/2020, 4:25 PM

## 2020-11-23 ENCOUNTER — Ambulatory Visit: Payer: Self-pay

## 2020-11-23 NOTE — Lactation Note (Signed)
This note was copied from a baby's chart. Lactation Consultation Note  Patient Name: Alicia Turner Date: 11/23/2020 Reason for consult: Follow-up assessment;Early term 37-38.6wks;NICU baby Age:26 days  DAT+  LC in to visit with P2 Mom of ET infant on day of discharge from NICU.  Mom is primarily breastfeeding, but baby is also receiving bottles of EBM.  Mom states her milk supply is plentiful, pumping 4oz per pumping.   Mom states she does not have a DEBP at home.  Mom has insurance.  [employer] pump paperwork filled out and rep called.  Fax not working and rep stated he would come to [phone] to pick up signed paperwork.  Mom desires OP lactation follow-up. Request sent.  Mom states she will plan to exclusively breastfeed and use a hand pump prn.   Baby started cueing after receiving a bottle of EBM.  Mom did latch baby easily in cradle hold.  LC placed a pillow to support under baby.  Encouraged STS and feeding baby often with cues.   Feeding Nipple Type: Nfant Slow Flow (purple)  LATCH Score Latch: Grasps breast easily, tongue down, lips flanged, rhythmical sucking.  Audible Swallowing: Spontaneous and intermittent  Type of Nipple: Everted at rest and after stimulation  Comfort (Breast/Nipple): Soft / non-tender  Hold (Positioning): No assistance needed to correctly position infant at breast.  LATCH Score: 10   Lactation Tools Discussed/Used Tools: Pump;Bottle Breast pump type: Double-Electric Breast Pump  Interventions Interventions: Breast feeding basics reviewed;Skin to skin;Breast massage;Hand express;DEBP  Discharge Discharge Education: Outpatient recommendation;Outpatient Epic message sent Pump: Stork Pump  Consult Status Consult Status: Complete Date: 11/23/20 Follow-up type: Out-patient    Judee Clara 11/23/2020, 8:55 AM

## 2020-11-27 ENCOUNTER — Ambulatory Visit: Payer: 59

## 2020-12-06 ENCOUNTER — Inpatient Hospital Stay (HOSPITAL_COMMUNITY): Admit: 2020-12-06 | Payer: Self-pay

## 2022-03-29 IMAGING — DX DG ANKLE COMPLETE 3+V*L*
3 series · 3 of 3 positions shown · non-contrast
Comparison: None.

CLINICAL DATA: Soft tissue swelling and pain

EXAM:
LEFT ANKLE COMPLETE - 3+ VIEW

[ankle ap]
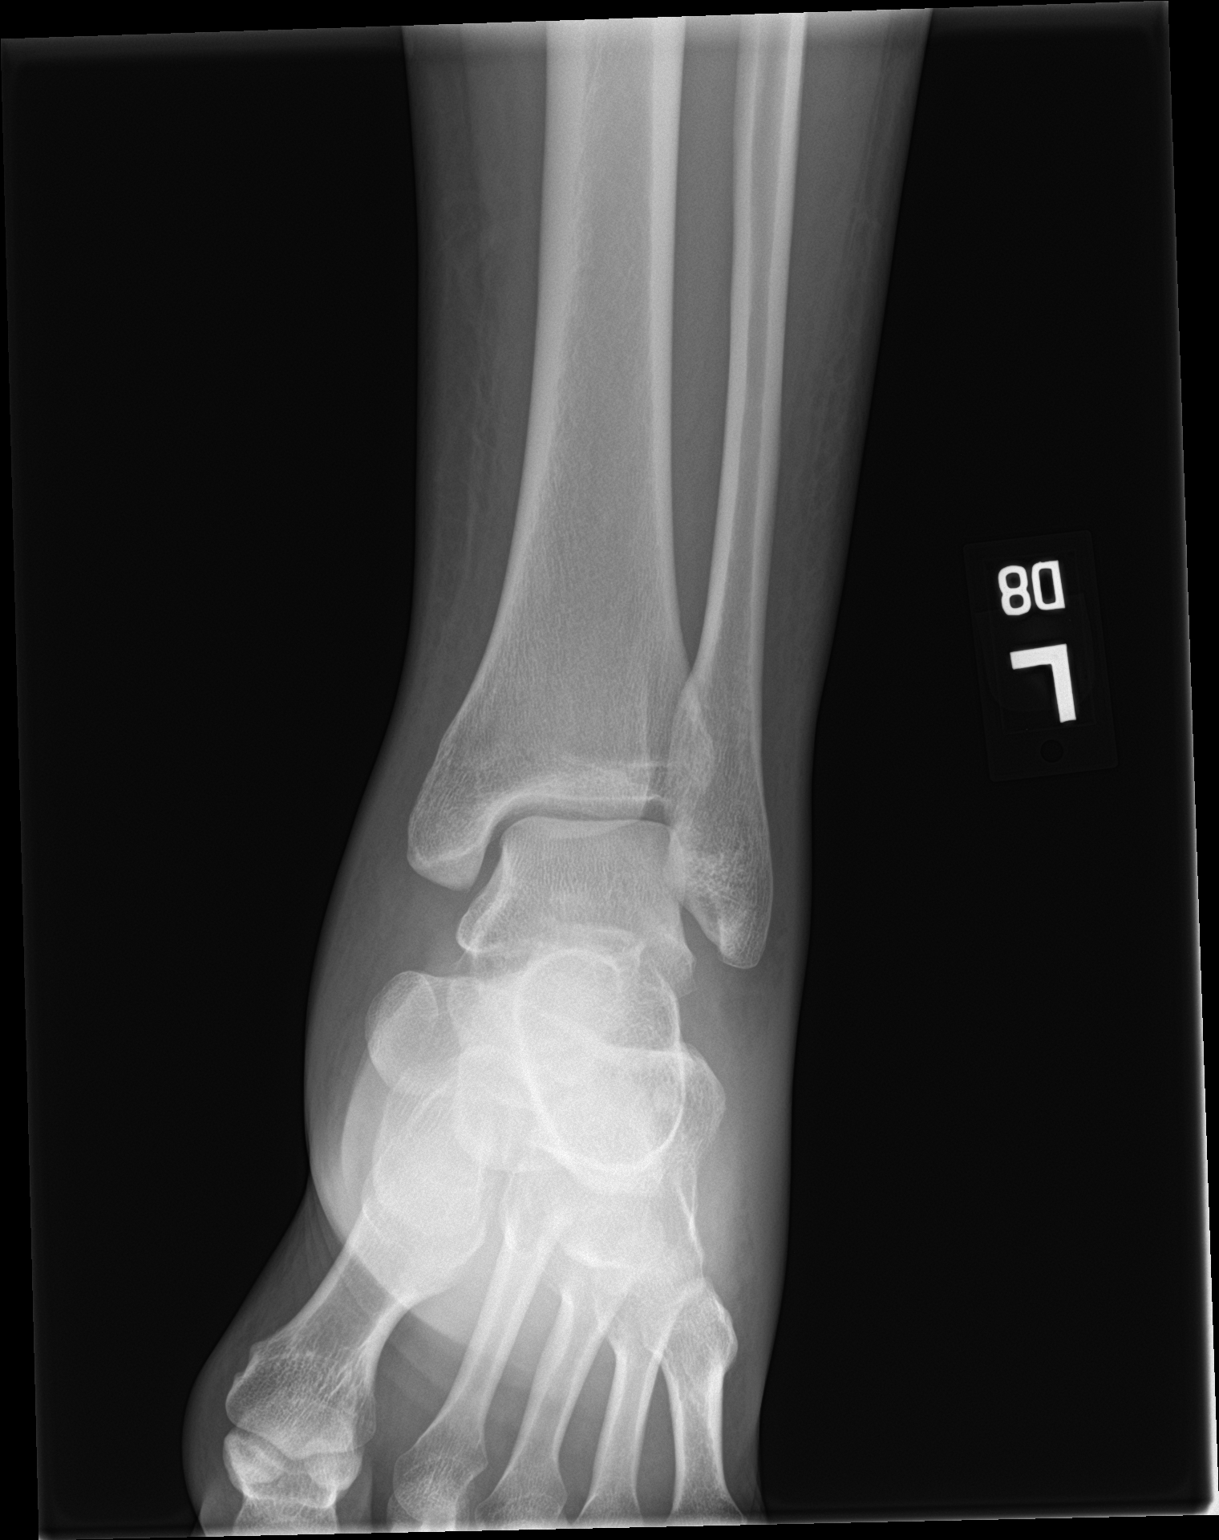

[ankle obl]
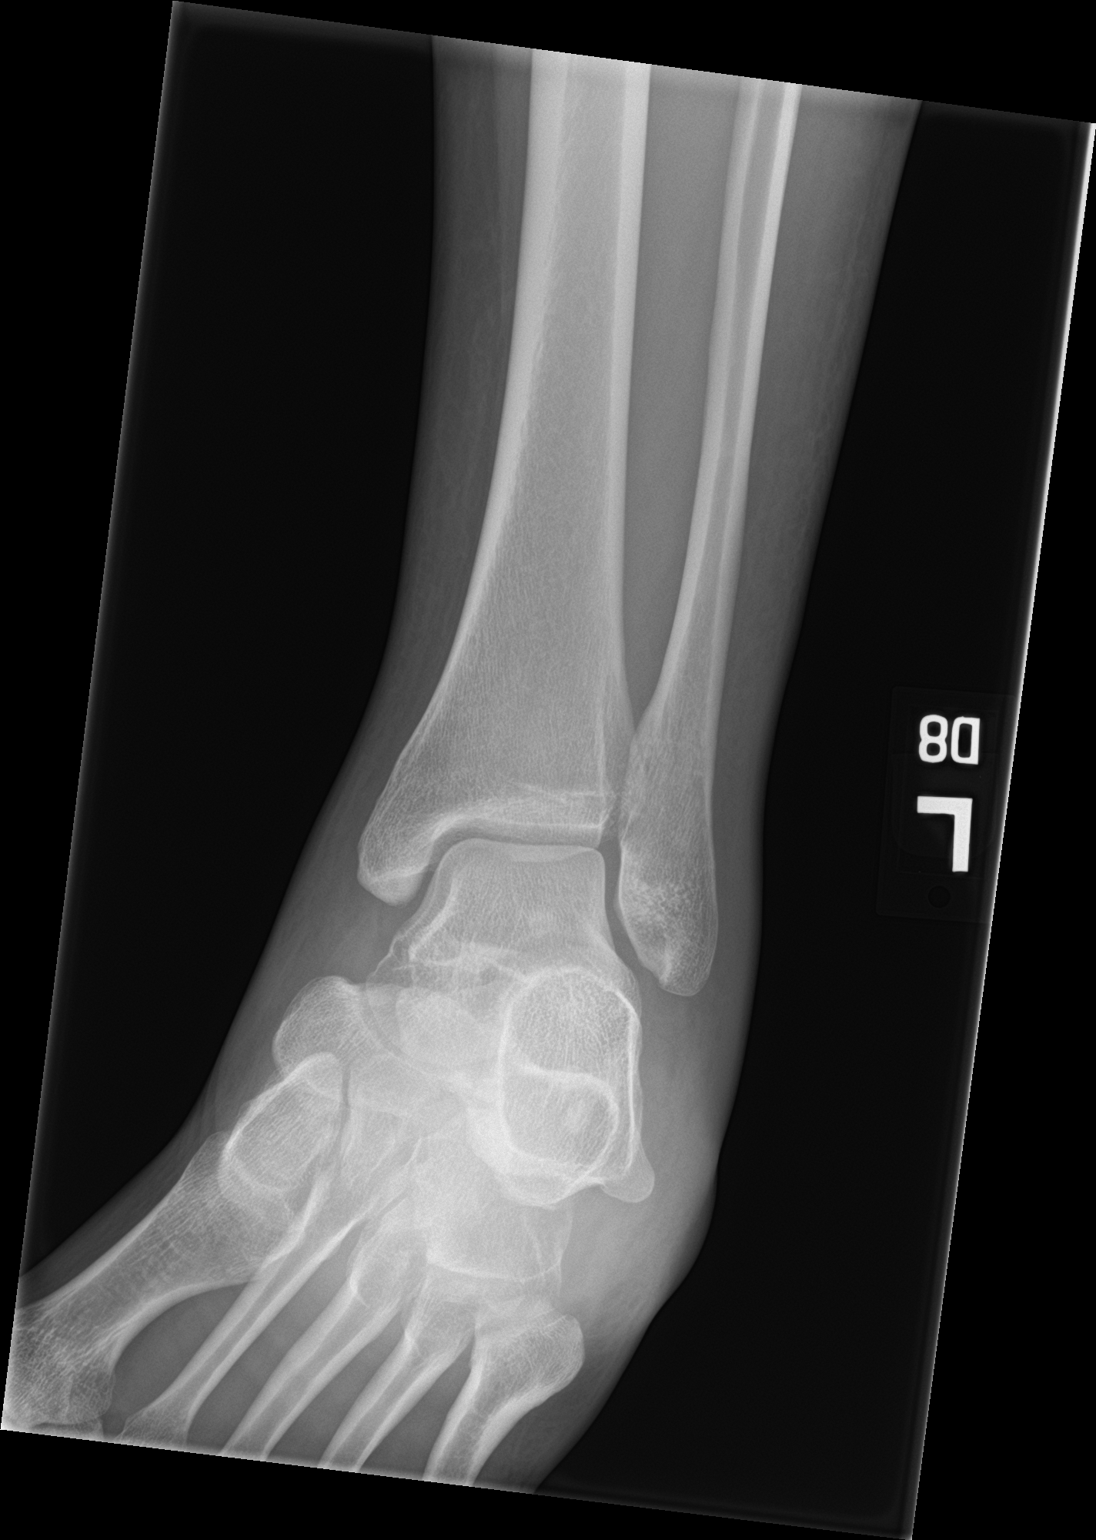

[ankle lat]
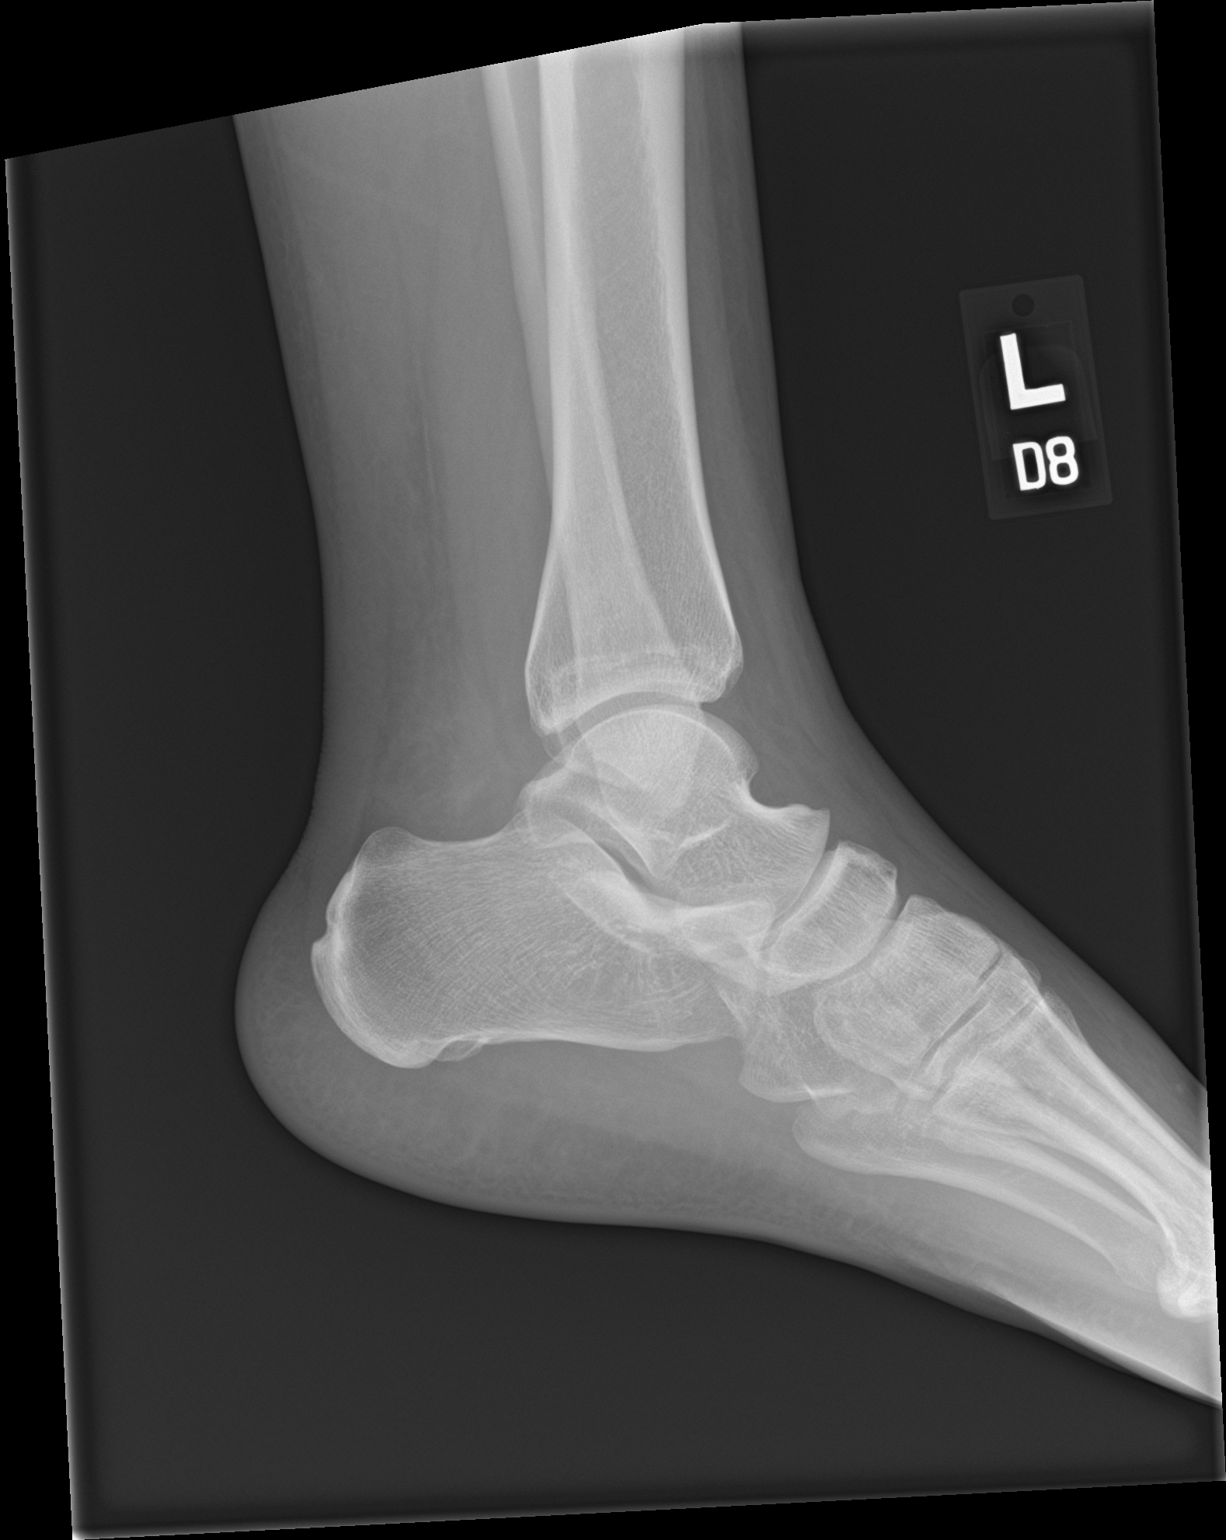

[3 of 3 positions shown; findings below may reference images not displayed]

FINDINGS: Frontal, oblique, and lateral views were obtained. There is soft
tissue swelling. No evident fracture or joint effusion. No joint
space narrowing or erosion. Ankle mortise appears intact.
IMPRESSION: Soft tissue swelling. No evident fracture or arthropathy. Ankle
mortise appears intact.

## 2022-04-19 IMAGING — US US BREAST*R* LIMITED INC AXILLA
1 series · 11 of 11 positions shown · non-contrast
Comparison: Previous exam(s).
COMPARISON: Previous exam(s).

Addendum:
CLINICAL DATA: Short-term interval follow-up in a 24-year-old
pregnant female. Patient presented with a large palpable right mass
which was biopsied on 06/16/2020 which showed acute and chronic
inflammation with multinucleated giant cells. Patient has completed
2 courses of antibiotics without resolution. The patient states the
palpable mass is less painful but has not significantly changed
since the prior ultrasound dated 07/03/2020.

EXAM:
ULTRASOUND OF THE RIGHT BREAST

[Series 1: us breast*right* limited inc axilla · 0.08mm/px · 11 of 11 slices shown]
[im 1/11]
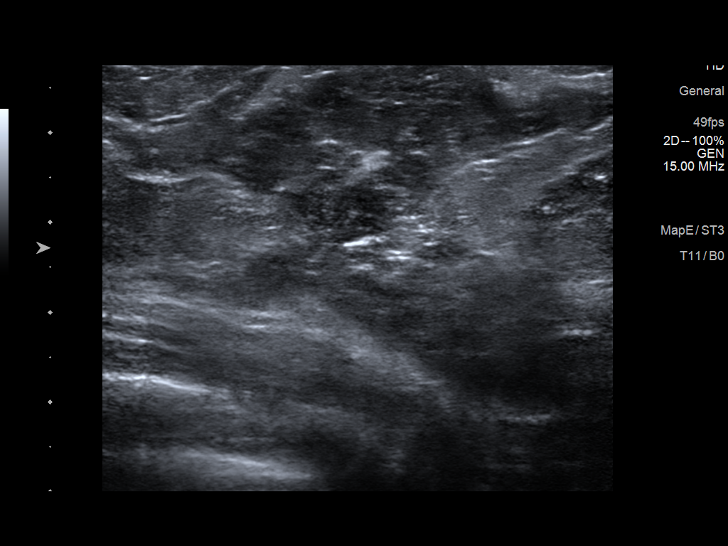
[im 2/11]
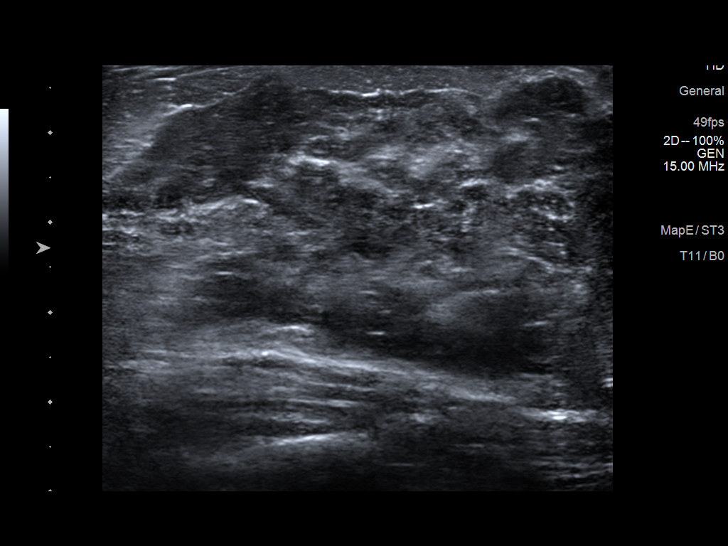
[im 3/11]
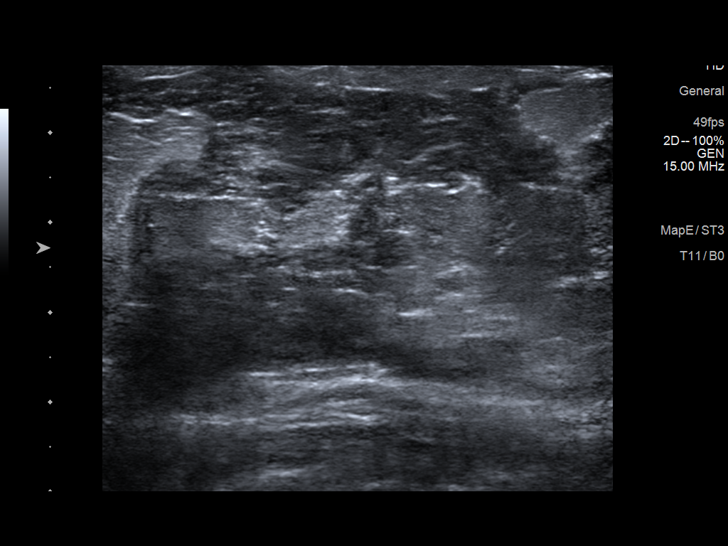
[im 4/11]
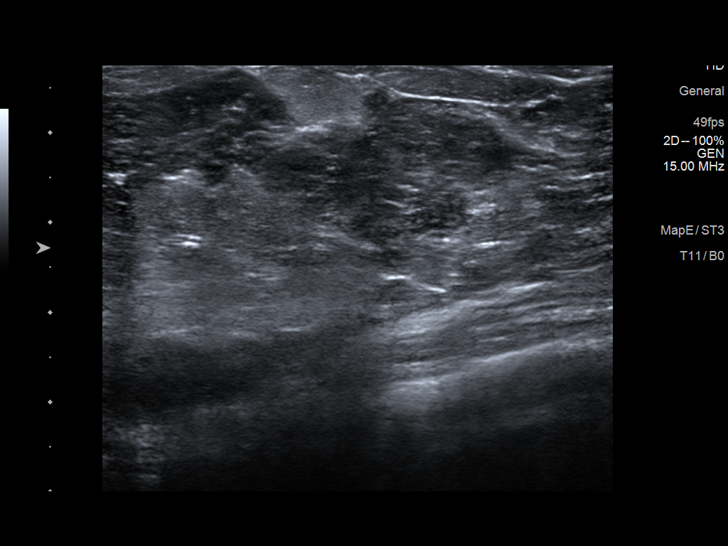
[im 5/11]
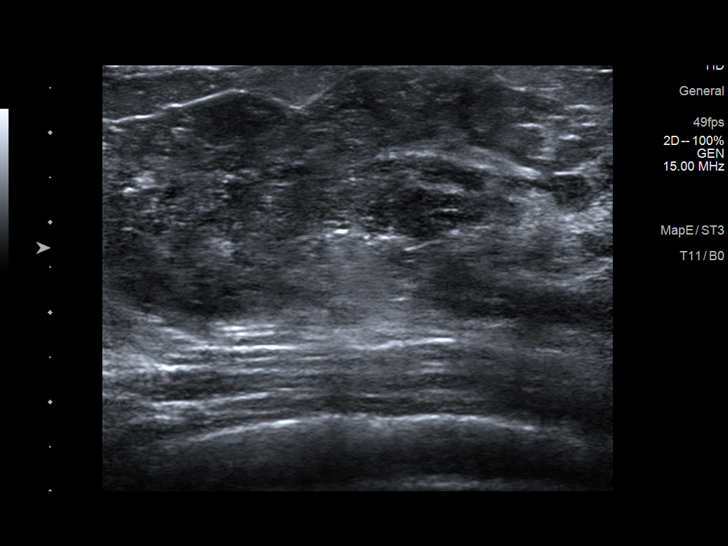
[im 6/11]
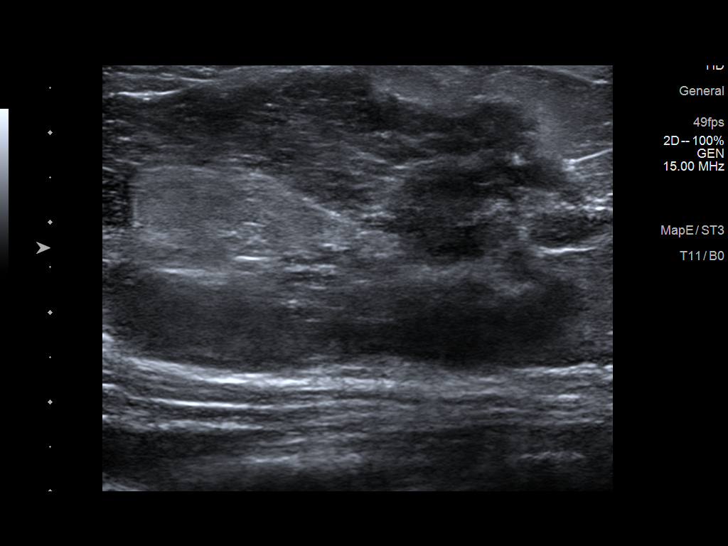
[im 7/11]
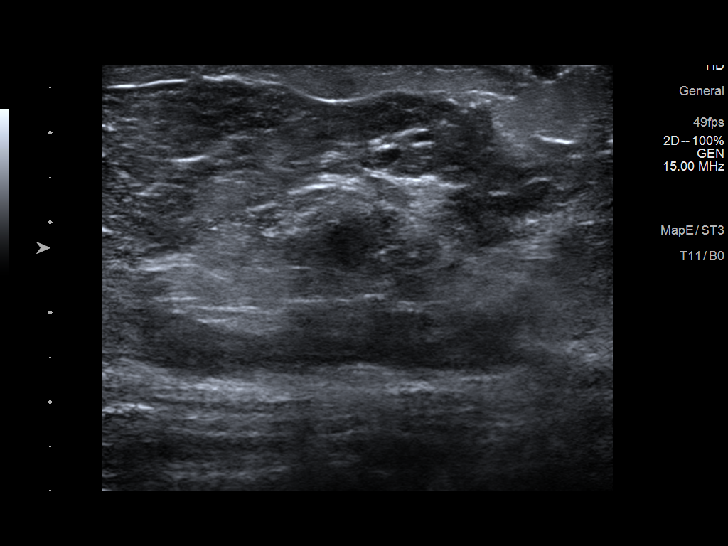
[im 8/11]
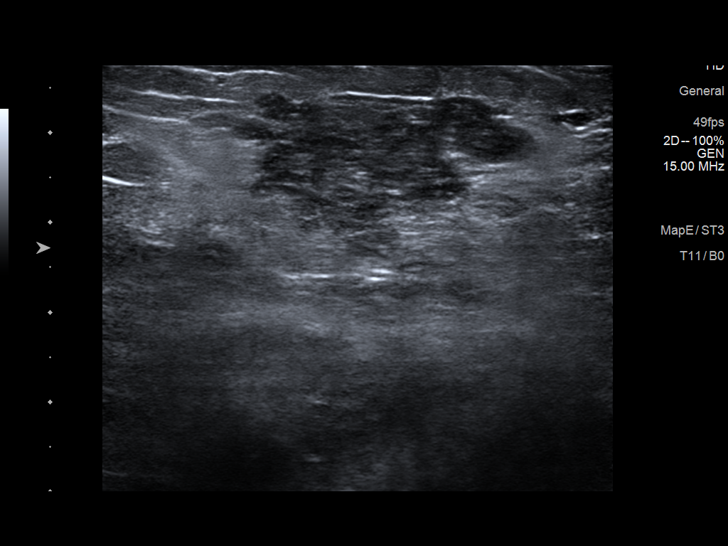
[im 9/11]
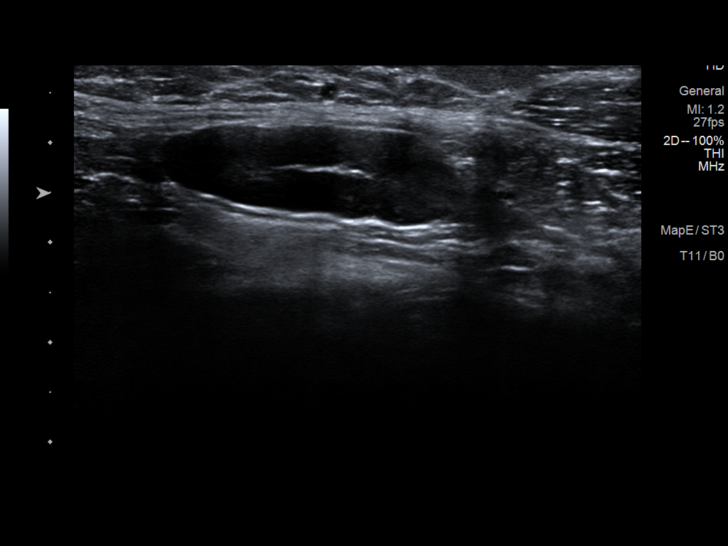
[im 10/11]
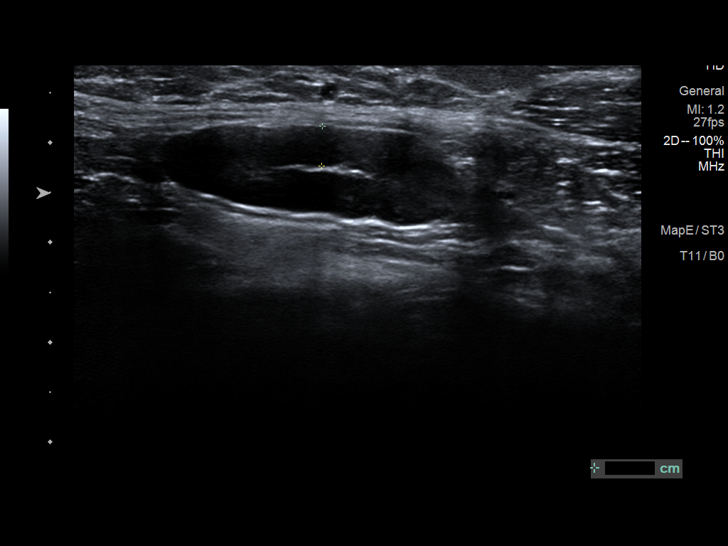
[im 11/11]
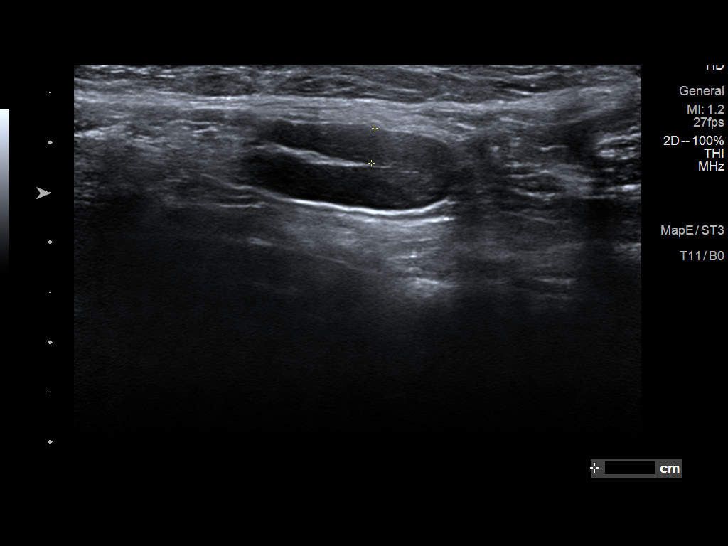

[11 of 11 positions shown; findings below may reference images not displayed]

FINDINGS: On physical exam, I palpate a large hard mass involving the entire
upper right breast.

Targeted ultrasound is performed, showing a complex heterogeneous
mass with indistinct borders in the upper right breast extending
from [DATE] to [DATE]. It appears to be unchanged from the prior
ultrasound dated 07/03/2020. Evaluation of the right axilla
demonstrates a stable appearance of a prominent lymph node.
IMPRESSION: No interval change in the complex heterogeneous mass in the upper
right breast.

RECOMMENDATION:
Recommend surgical consultation of the large palpable heterogeneous
mass in the upper right breast. Surgical consultation will be
scheduled with [REDACTED].

I have discussed the findings and recommendations with the patient.
If applicable, a reminder letter will be sent to the patient
regarding the next appointment.

BI-RADS CATEGORY  4: Suspicious.

ADDENDUM:
Surgical consultation has been arranged with Dr. Kethlin Copini at
[REDACTED] on July 28, 2020.

Ihang Pramudibyo Msi Jrengik RN on 07/18/2020

*** End of Addendum ***
FINDINGS: On physical exam, I palpate a large hard mass involving the entire
upper right breast.

Targeted ultrasound is performed, showing a complex heterogeneous
mass with indistinct borders in the upper right breast extending
from [DATE] to [DATE]. It appears to be unchanged from the prior
ultrasound dated 07/03/2020. Evaluation of the right axilla
demonstrates a stable appearance of a prominent lymph node.
IMPRESSION: No interval change in the complex heterogeneous mass in the upper
right breast.

RECOMMENDATION:
Recommend surgical consultation of the large palpable heterogeneous
mass in the upper right breast. Surgical consultation will be
scheduled with [REDACTED].

I have discussed the findings and recommendations with the patient.
If applicable, a reminder letter will be sent to the patient
regarding the next appointment.

BI-RADS CATEGORY  4: Suspicious.

## 2022-07-25 ENCOUNTER — Encounter: Payer: Self-pay | Admitting: Emergency Medicine

## 2022-07-25 ENCOUNTER — Other Ambulatory Visit: Payer: Self-pay

## 2022-07-25 ENCOUNTER — Ambulatory Visit (INDEPENDENT_AMBULATORY_CARE_PROVIDER_SITE_OTHER): Payer: Medicaid Other

## 2022-07-25 ENCOUNTER — Ambulatory Visit
Admission: EM | Admit: 2022-07-25 | Discharge: 2022-07-25 | Disposition: A | Discharge status: Home / Self Care | Payer: Medicaid Other | Attending: Physician Assistant | Admitting: Physician Assistant

## 2022-07-25 DIAGNOSIS — R809 Proteinuria, unspecified: Secondary | ICD-10-CM

## 2022-07-25 DIAGNOSIS — I498 Other specified cardiac arrhythmias: Secondary | ICD-10-CM

## 2022-07-25 DIAGNOSIS — R059 Cough, unspecified: Secondary | ICD-10-CM

## 2022-07-25 DIAGNOSIS — R1011 Right upper quadrant pain: Secondary | ICD-10-CM | POA: Diagnosis not present

## 2022-07-25 LAB — POCT URINALYSIS DIP (MANUAL ENTRY)
Bilirubin, UA: NEGATIVE
Blood, UA: NEGATIVE
Glucose, UA: NEGATIVE mg/dL
Ketones, POC UA: NEGATIVE mg/dL
Leukocytes, UA: NEGATIVE
Nitrite, UA: NEGATIVE
Protein Ur, POC: 30 mg/dL — AB
Spec Grav, UA: 1.02 (ref 1.010–1.025)
Urobilinogen, UA: 0.2 E.U./dL
pH, UA: 8.5 — AB (ref 5.0–8.0)

## 2022-07-25 LAB — POCT URINE PREGNANCY: Preg Test, Ur: NEGATIVE

## 2022-07-25 NOTE — ED Provider Notes (Addendum)
[name]    CSN: [id number] Arrival date & time: 07/25/22  1023      History   Chief Complaint Chief Complaint  Patient presents with   Cough    HPI Alicia Turner is a 26 y.o. female.   Patient here today for evaluation of cough she has had for about 2 months. She notes that cough has become productive of black tinged sputum at times. She does note some lower right chest pain/ right upper abdominal pain that is significantly worse with cough. She has not had fever.   She also reports "white stuff" floating in her urine.   The history is provided by the patient.  Cough Associated symptoms: chest pain   Associated symptoms: no chills, no eye discharge, no fever and no rhinorrhea     Past Medical History:  Diagnosis Date   Eczema    Hyperthyroidism     Patient Active Problem List   Diagnosis Date Noted   Non-reassuring fetal heart rate or rhythm affecting mother 11/17/2020    Past Surgical History:  Procedure Laterality Date   NO PAST SURGERIES      OB History     Gravida  4   Para  1   Term  1   Preterm  0   AB  2   Living  1      SAB  2   IAB      Ectopic      Multiple      Live Births  1            Home Medications    Prior to Admission medications   Medication Sig Start Date End Date Taking? Authorizing Provider  acetaminophen (TYLENOL) 325 MG tablet Take 2 tablets (650 mg total) by mouth every 4 (four) hours as needed (for pain scale < 4). 11/20/20   Venora Maples, MD  amLODipine (NORVASC) 5 MG tablet Take 1 tablet (5 mg total) by mouth daily. 11/21/20   Venora Maples, MD  amLODipine (NORVASC) 5 MG tablet TAKE 1 TABLET (5 MG TOTAL) BY MOUTH DAILY. 11/20/20 11/20/21  Venora Maples, MD  ibuprofen (ADVIL) 600 MG tablet Take 1 tablet (600 mg total) by mouth every 6 (six) hours. 11/20/20   Venora Maples, MD  polyethylene glycol powder (GLYCOLAX/MIRALAX) 17 GM/SCOOP powder Take 17 g by mouth daily as  needed. 11/20/20   Venora Maples, MD  Prenatal Vit-Fe Fumarate-FA (PRENATAL VITAMIN PO) Take 1 tablet by mouth daily.    [provider]    Family History Family History  Problem Relation Age of Onset   Arthritis Mother        rheumatoid   Hyperlipidemia Mother    Healthy Father     Social History Social History   Tobacco Use   Smoking status: Never   Smokeless tobacco: Never  Vaping Use   Vaping Use: Never used  Substance Use Topics   Alcohol use: Never   Drug use: Never     Allergies   Patient has no known allergies.   Review of Systems Review of Systems  Constitutional:  Negative for chills and fever.  HENT:  Negative for congestion and rhinorrhea.   Eyes:  Negative for discharge and redness.  Respiratory:  Positive for cough.   Cardiovascular:  Positive for chest pain.  Gastrointestinal:  Positive for abdominal pain. Negative for diarrhea and vomiting.  Genitourinary:  Negative for hematuria.     Physical  Exam Triage Vital Signs ED Triage Vitals  Enc Vitals Group     BP 07/25/22 1147 123/73     Pulse Rate 07/25/22 1147 77     Resp 07/25/22 1147 18     Temp 07/25/22 1147 98.3 F (36.8 C)     Temp Source 07/25/22 1147 Oral     SpO2 07/25/22 1147 98 %     Weight --      Height --      Head Circumference --      Peak Flow --      Pain Score 07/25/22 1148 5     Pain Loc --      Pain Edu? --      Excl. in [place]? --    No data found.  Updated Vital Signs BP 123/73 (BP Location: Left Arm)   Pulse 77   Temp 98.3 F (36.8 C) (Oral)   Resp 18   SpO2 98%     Physical Exam Vitals and nursing note reviewed.  Constitutional:      General: She is not in acute distress.    Appearance: Normal appearance. She is not ill-appearing.  HENT:     Head: Normocephalic and atraumatic.  Eyes:     Conjunctiva/sclera: Conjunctivae normal.  Cardiovascular:     Rate and Rhythm: Normal rate. Rhythm irregular.     Comments: Frequent ectopy Pulmonary:      Effort: Pulmonary effort is normal. No respiratory distress.     Breath sounds: Normal breath sounds. No wheezing, rhonchi or rales.  Chest:     Chest wall: Tenderness (anterior right lower ribs/ right upper quadrant of abdomen) present.  Neurological:     Mental Status: She is alert.  Psychiatric:     Comments: Tearful at times      UC Treatments / Results  Labs (all labs ordered are listed, but only abnormal results are displayed) Labs Reviewed  POCT URINALYSIS DIP (MANUAL ENTRY) - Abnormal; Notable for the following components:      Result Value   Clarity, UA cloudy (*)    pH, UA 8.5 (*)    Protein Ur, POC =30 (*)    All other components within normal limits  POCT URINE PREGNANCY    EKG   Radiology DG Chest 2 View  Result Date: 07/25/2022 CLINICAL DATA:  Cough EXAM: CHEST - 2 VIEW COMPARISON:  None Available. FINDINGS: The heart size and mediastinal contours are within normal limits. Both lungs are clear. The visualized skeletal structures are unremarkable. IMPRESSION: No active cardiopulmonary disease. Electronically Signed   By: Elmer Picker M.D.   On: 07/25/2022 12:44    Procedures Procedures (including critical care time)  Medications Ordered in UC Medications - No data to display  Initial Impression / Assessment and Plan / UC Course  I have reviewed the triage vital signs and the nursing notes.  Pertinent labs & imaging results that were available during my care of the patient were reviewed by me and considered in my medical decision making (see chart for details).    Chest x-ray is clear.  Given normal imaging, proteinuria with alkaline urine, sinus arrhythmia with sinus bradycardia previously mentioned with no EKG for comparison recommended further evaluation in the emergency department for labs and further imaging.  Patient agrees with plan and will report to [facility] emergency room after  leaving our office.  Final Clinical Impressions(s) /  UC Diagnoses   Final diagnoses:  Right upper quadrant pain  Cough,  unspecified type  Sinus arrhythmia  Proteinuria, unspecified type   Discharge Instructions   None    ED Prescriptions   None    PDMP not reviewed this encounter.   Tomi Bamberger, PA-C 07/25/22 1303    Tomi Bamberger, PA-C 07/25/22 1304

## 2022-07-25 NOTE — ED Triage Notes (Signed)
Pt here for cough with some pain with cough on right side and some "black tinged sputum"; pt sts cough x 2 months; pt sts "white stuff floating in her urine"

## 2022-07-25 NOTE — ED Notes (Signed)
Patient is being discharged from the Urgent Care and sent to the Emergency Department via POV . Per RM, patient is in need of higher level of care due to eval of abnormal electrolytes and imaging . Patient is aware and verbalizes understanding of plan of care.  Vitals:   07/25/22 1147  BP: 123/73  Pulse: 77  Resp: 18  Temp: 98.3 F (36.8 C)  SpO2: 98%

## 2022-07-26 ENCOUNTER — Emergency Department (HOSPITAL_COMMUNITY): Payer: Medicaid Other

## 2022-07-26 ENCOUNTER — Encounter (HOSPITAL_COMMUNITY): Payer: Self-pay

## 2022-07-26 ENCOUNTER — Emergency Department (HOSPITAL_COMMUNITY)
Admission: EM | Admit: 2022-07-26 | Discharge: 2022-07-26 | Disposition: A | Discharge status: Home / Self Care | Payer: Medicaid Other | Attending: Emergency Medicine | Admitting: Emergency Medicine

## 2022-07-26 ENCOUNTER — Other Ambulatory Visit: Payer: Self-pay

## 2022-07-26 DIAGNOSIS — R1011 Right upper quadrant pain: Secondary | ICD-10-CM | POA: Diagnosis not present

## 2022-07-26 DIAGNOSIS — E039 Hypothyroidism, unspecified: Secondary | ICD-10-CM | POA: Diagnosis not present

## 2022-07-26 DIAGNOSIS — R059 Cough, unspecified: Secondary | ICD-10-CM

## 2022-07-26 DIAGNOSIS — N9489 Other specified conditions associated with female genital organs and menstrual cycle: Secondary | ICD-10-CM | POA: Insufficient documentation

## 2022-07-26 DIAGNOSIS — R0781 Pleurodynia: Secondary | ICD-10-CM | POA: Diagnosis not present

## 2022-07-26 DIAGNOSIS — I498 Other specified cardiac arrhythmias: Secondary | ICD-10-CM

## 2022-07-26 DIAGNOSIS — I499 Cardiac arrhythmia, unspecified: Secondary | ICD-10-CM | POA: Insufficient documentation

## 2022-07-26 DIAGNOSIS — R0789 Other chest pain: Secondary | ICD-10-CM

## 2022-07-26 LAB — CBC
HCT: 42 % (ref 36.0–46.0)
Hemoglobin: 13.3 g/dL (ref 12.0–15.0)
MCH: 28.5 pg (ref 26.0–34.0)
MCHC: 31.7 g/dL (ref 30.0–36.0)
MCV: 90.1 fL (ref 80.0–100.0)
Platelets: 286 10*3/uL (ref 150–400)
RBC: 4.66 MIL/uL (ref 3.87–5.11)
RDW: 12.9 % (ref 11.5–15.5)
WBC: 7.5 10*3/uL (ref 4.0–10.5)
nRBC: 0 % (ref 0.0–0.2)

## 2022-07-26 LAB — COMPREHENSIVE METABOLIC PANEL
ALT: 13 U/L (ref 0–44)
AST: 20 U/L (ref 15–41)
Albumin: 4 g/dL (ref 3.5–5.0)
Alkaline Phosphatase: 43 U/L (ref 38–126)
Anion gap: 5 (ref 5–15)
BUN: 12 mg/dL (ref 6–20)
CO2: 27 mmol/L (ref 22–32)
Calcium: 8.7 mg/dL — ABNORMAL LOW (ref 8.9–10.3)
Chloride: 105 mmol/L (ref 98–111)
Creatinine, Ser: 0.69 mg/dL (ref 0.44–1.00)
GFR, Estimated: >60 mL/min (ref >60)
Glucose, Bld: 98 mg/dL (ref 70–99)
Potassium: 3.9 mmol/L (ref 3.5–5.1)
Sodium: 137 mmol/L (ref 135–145)
Total Bilirubin: 0.4 mg/dL (ref 0.3–1.2)
Total Protein: 7.4 g/dL (ref 6.5–8.1)

## 2022-07-26 LAB — I-STAT BETA HCG BLOOD, ED (MC, WL, AP ONLY): I-stat hCG, quantitative: <5 m[IU]/mL (ref <5)

## 2022-07-26 MED ORDER — PREDNISONE 10 MG (21) PO TBPK: ORAL_TABLET | Freq: Every day | ORAL | 0 refills | Status: DC

## 2022-07-26 MED ORDER — ALBUTEROL SULFATE HFA 108 (90 BASE) MCG/ACT IN AERS: 1.0000 | INHALATION_SPRAY | Freq: Four times a day (QID) | RESPIRATORY_TRACT | 1 refills | Status: DC | PRN

## 2022-07-26 MED ORDER — ALBUTEROL SULFATE HFA 108 (90 BASE) MCG/ACT IN AERS
1.0000 | INHALATION_SPRAY | Freq: Once | RESPIRATORY_TRACT | Status: AC
Start: 2022-07-26 — End: 2022-07-26
  Administered 2022-07-26: 1 via RESPIRATORY_TRACT
  Filled 2022-07-26: qty 6.7

## 2022-07-26 NOTE — ED Triage Notes (Signed)
Patient report that she has been having a cough since the end of August. Patient c/o right rib cage pain. Patient states she has been having "black specks in her sputum"

## 2022-07-26 NOTE — Discharge Instructions (Addendum)
Your imaging and labs today were overall reassuring.  2 prescriptions have been sent to your pharmacy.  The first is a prednisone Dosepak, to help open up your lungs and improve breathing.  The second is an albuterol refill.  Please continue to utilize the albuterol every 4-6 hours as needed.  Also continue to manage pain with both Tylenol as needed.  Please keep in mind maximum Tylenol daily dose of 4000 mg (1000 mg every 6 hours).  Please follow-up with PCP within the next few days for reevaluation and continued medical management.  I have also provided contact information for local pulmonologist for further evaluation as well.  Return to the ED for new or worsening symptoms as discussed.  If you do not have a doctor see the list below.  RESOURCE GUIDE  Chronic Pain Problems: Contact [facility]  [phone] Patients need to be referred by their primary care doctor.  Insufficient Money for Medicine: Contact United Way:  call "211" or [place] [phone].  No Primary Care Doctor: Call [facility]  [phone] - can help you locate a primary care doctor that  accepts your insurance, provides certain services, etc. Physician Referral Service- [phone] Agencies that provide inexpensive medical care: [facility]  [facility]  [phone] [facility]  [phone] [facility]  [phone] Planned Parenthood  [phone] [facility]  [phone]  [place] Providers: [facility]- [address]  [phone], Mon-Fri 9am-7pm, Sat 9am-1pm [place], [address], [address] [place]  [place]- [address]  [place], [address], [phone]  Only accepts [place] patients  after they have their name  applied to their card  Self Pay (no insurance) in [facility]: Sickle Cell Patients: Dr Kevan Ny, [facility]  [place], [facility]- [place]  [facility]- [address], [facility]- see information above (Speak to [employer] if you do not have insurance)       -  [facility]- [place], [place],  [place], [place]  Dr Vista Lawman-  [address], [place], [place] [facility]- [address] [phone]       -  [facility]- [address], [place], also [address] [phone]       -    [facility]- [address], [place], 1st & 3rd Saturday   every month, 10am-1pm  1) Find a Doctor and Pay Out of Pocket Although you won't have to find out who is covered by your insurance plan, it is a good idea to ask around and get recommendations. You will then need to call the office and see if the doctor you have chosen will accept you as  a new patient and what types of options they offer for patients who are self-pay. Some doctors offer discounts or will set up payment plans for their patients who do not have insurance, but you will need to ask so you aren't surprised when you get to your appointment.  2) Contact Your Local Health Department Not all health departments have doctors that can see patients for sick visits, but many do, so it is worth a call to see if yours does. If you don't know where your local health department is, you can check in your phone book. The CDC also has a tool to help you locate your state's health department, and many state websites also have listings of all of their local health departments.  3) Find a  Walk-in Clinic If your illness is not likely to be very severe or complicated, you may want to try a walk in clinic. These are popping up all over the country in pharmacies, drugstores, and shopping centers. They're usually staffed by nurse practitioners or physician assistants that have been trained to treat common illnesses and complaints. They're usually fairly quick and inexpensive. However, if you have serious medical issues or chronic medical problems, these are probably not your best option  STD Testing [facility] Department of [facility] [place], STD Clinic, [address] [place], phone [phone] or [phone].  Monday - Friday, call for an appointment. [facility] Department of [employer], STD Clinic, [place] [address], [place], phone [phone] or [phone].  Monday - Friday, call for an appointment.  Abuse/Neglect: [facility] Child Abuse Hotline [phone] [facility] Child Abuse Hotline 5853064667 (After Hours)  Emergency Shelter:  [facility] 2062015379  Maternity Homes: [facility] at the [place] [facility] 8055755315 [facility] 5625521076  MRSA Hotline #:   (669)862-4095  [facility] Resources  [facility] [place]     United Way                          [facility] Dept. [address] [place]                       [address]      [address] [place] [address]  Blondell Reveal Phone:  [phone]                                   Phone:  [phone]                 Phone:  [phone]  [facility], [phone] [facility] [phone]       -     [facility] in [place], [address]  [phone], [facility] Child Abuse  Hotline 6031975964 or [phone] (After Hours)  Behavioral Health Services  Substance Abuse Resources: [facility] and Drug Services  9856885928 [facility] [phone] [facility] [place] [phone] Floydene Flock 9070501634 Residential & Outpatient Substance Abuse Program  [phone]  Psychological Services: [facility]  [phone] [facility]  [phone] [facility], [phone] [place]. [address], [place], ACCESS LINE: 703-572-9774 or [phone], [employer]  Dental Assistance  Patients with Medicaid: [facility] [phone] [address]                                           [phone] [address] [employer] Phone:  [phone]                                                  Phone:  [phone]  If unable to pay or uninsured, contact:  [facility] or [facility]. to become qualified for the adult dental clinic.  Patients with Medicaid: [facility] [phone] [address], [phone] [address] [phone]  If unable to pay, or uninsured, contact [facility] 2480719944) or [facility] Department [phone] in [place], [phone] in [place]) to become qualified for the adult dental clinic  Other Low-Cost Community Dental Services: [facility]- [address] [place], [place], [place], [address], [phone], [phone], 2nd and 4th Thursday of the month at 6:30am.  10 clients each day by appointment, can sometimes see walk-in patients if someone does not show for an appointment. [facility]- [address] [place], [place], [address], [phone] [facility] [address] [place], [place], [address], [phone] [facility] Health Department- [phone] [facility] Health Department- [phone] [facility] [phone]

## 2022-07-26 NOTE — ED Provider Notes (Signed)
Physical Exam  BP 130/80 (BP Location: Right Arm)   Pulse 76   Temp 98.2 F (36.8 C) (Oral)   Resp 18   Ht 5' 5.5" (1.664 m)   Wt 95.3 kg   LMP 07/17/2022   SpO2 92%   BMI 34.41 kg/m   Physical Exam Vitals and nursing note reviewed.  Constitutional:      General: She is not in acute distress.    Appearance: She is well-developed. She is obese. She is not ill-appearing, toxic-appearing or diaphoretic.  HENT:     Head: Normocephalic and atraumatic.     Nose: Nose normal.     Mouth/Throat:     Mouth: Mucous membranes are moist.     Tongue: Tongue does not deviate from midline.     Pharynx: Oropharynx is clear. Uvula midline. No posterior oropharyngeal erythema.  Eyes:     General: No scleral icterus.    Conjunctiva/sclera: Conjunctivae normal.  Neck:     Comments: No meningismus, very supple on exam. Cardiovascular:     Rate and Rhythm: Normal rate and regular rhythm.     Pulses: Normal pulses.  Pulmonary:     Effort: Pulmonary effort is normal. No respiratory distress.     Breath sounds: No stridor. No wheezing, rhonchi or rales.     Comments: CTAB, able to communicate without difficulty, without increased respiratory effort (after receiving dose of albuterol inhaler) Chest:     Chest wall: Tenderness (Right side of ribcage, reproducible with palpation) present.  Abdominal:     General: There is no distension.     Palpations: Abdomen is soft.     Tenderness: There is no abdominal tenderness. There is no guarding.  Musculoskeletal:     Cervical back: Neck supple.  Skin:    General: Skin is warm and dry.     Capillary Refill: Capillary refill takes less than 2 seconds.     Coloration: Skin is not jaundiced or pale.  Neurological:     Mental Status: She is alert and oriented to person, place, and time.     Gait: Gait normal.  Psychiatric:        Mood and Affect: Mood is anxious. Affect is tearful.     Procedures  Procedures  ED Course / MDM    Results for  orders placed or performed during the hospital encounter of 07/26/22  CBC  Result Value Ref Range   WBC 7.5 4.0 - 10.5 K/uL   RBC 4.66 3.87 - 5.11 MIL/uL   Hemoglobin 13.3 12.0 - 15.0 g/dL   HCT 86.7 61.9 - 50.9 %   MCV 90.1 80.0 - 100.0 fL   MCH 28.5 26.0 - 34.0 pg   MCHC 31.7 30.0 - 36.0 g/dL   RDW 32.6 71.2 - 45.8 %   Platelets 286 150 - 400 K/uL   nRBC 0.0 0.0 - 0.2 %  Comprehensive metabolic panel  Result Value Ref Range   Sodium 137 135 - 145 mmol/L   Potassium 3.9 3.5 - 5.1 mmol/L   Chloride 105 98 - 111 mmol/L   CO2 27 22 - 32 mmol/L   Glucose, Bld 98 70 - 99 mg/dL   BUN 12 6 - 20 mg/dL   Creatinine, Ser 0.99 0.44 - 1.00 mg/dL   Calcium 8.7 (L) 8.9 - 10.3 mg/dL   Total Protein 7.4 6.5 - 8.1 g/dL   Albumin 4.0 3.5 - 5.0 g/dL   AST 20 15 - 41 U/L   ALT 13  0 - 44 U/L   Alkaline Phosphatase 43 38 - 126 U/L   Total Bilirubin 0.4 0.3 - 1.2 mg/dL   GFR, Estimated >60 >60 mL/min   Anion gap 5 5 - 15  I-Stat Beta hCG blood, ED ([facility], [facility], [facility] only)  Result Value Ref Range   I-stat hCG, quantitative <5.0 <5 mIU/mL   Comment 3           CT Chest Wo Contrast  Result Date: 07/26/2022 CLINICAL DATA:  Dyspnea of unclear etiology. EXAM: CT CHEST WITHOUT CONTRAST TECHNIQUE: Multidetector CT imaging of the chest was performed following the standard protocol without IV contrast. RADIATION DOSE REDUCTION: This exam was performed according to the departmental dose-optimization program which includes automated exposure control, adjustment of the mA and/or kV according to patient size and/or use of iterative reconstruction technique. COMPARISON:  Chest x-ray of the same date. FINDINGS: Cardiovascular: Normal caliber of the thoracic aorta. Normal heart size. No pericardial effusion. Normal caliber central pulmonary vessels. Limited assessment of cardiovascular structures given lack of intravenous contrast. Mediastinum/Nodes: No thoracic inlet, axillary, mediastinal or hilar adenopathy. Esophagus  grossly normal. Lungs/Pleura: No consolidation. No sign of pleural effusion. Airways are patent. Mild basilar atelectasis. Upper Abdomen: Incidental imaging of upper abdominal contents is unremarkable. Imaged portions the liver, pancreas, spleen, adrenal glands and kidneys without acute findings. Musculoskeletal: No chest wall mass or suspicious bone lesions identified. IMPRESSION: 1. No acute cardiopulmonary findings. 2. Mild basilar atelectasis. Electronically Signed   By: Zetta Bills M.D.   On: 07/26/2022 15:05   DG Chest 2 View  Result Date: 07/26/2022 CLINICAL DATA:  Patient reported that she has been having a productive cough since the end of August with no improvement. Patient stated she has been having "black specks in her sputum".cough, sputum EXAM: CHEST - 2 VIEW COMPARISON:  None Available. FINDINGS: Normal mediastinum and cardiac silhouette. Normal pulmonary vasculature. No evidence of effusion, infiltrate, or pneumothorax. No acute bony abnormality. IMPRESSION: No acute cardiopulmonary process. Electronically Signed   By: Suzy Bouchard M.D.   On: 07/26/2022 13:27   DG Chest 2 View  Result Date: 07/25/2022 CLINICAL DATA:  Cough EXAM: CHEST - 2 VIEW COMPARISON:  None Available. FINDINGS: The heart size and mediastinal contours are within normal limits. Both lungs are clear. The visualized skeletal structures are unremarkable. IMPRESSION: No active cardiopulmonary disease. Electronically Signed   By: Elmer Picker M.D.   On: 07/25/2022 12:44    Medical Decision Making Amount and/or Complexity of Data Reviewed Labs: ordered. Decision-making details documented in ED Course. Radiology: ordered and independent interpretation performed. Decision-making details documented in ED Course. ECG/medicine tests: ordered and independent interpretation performed. Decision-making details documented in ED Course.  Risk Prescription drug management.   Patient signed out to me at shift  change.  Please see previous provider note for further details.  In short, this is a 26 year old female G with chest wall pain and prolonged cough seen yesterday.  Has been coughing since Aug 2023 following upper respiratory infection.  Also was noted to have mild protein in urine per patient.  Recommended by UC to come in for further evaluation.  Current differential of acute bronchitis vs post viral cough syndrome at time of handoff.  Needs PCP, resources given.  Plan for DC with prednisone and inhaler at time of care transfer pending workup.  Workup pertinent findings: --CXR and CT chest negative for acute cardiopulmonary findings --Labs unremarkable --PERC negative, Well's score of 0, low risk for PE --  Cardiac monitoring: sinus arrhythmia, difficulty crossing over electronically, no evidence of Afib --UA pending collection  Medications: --Albuterol  Consultations --None  Plan --Pt's mother present with patient at bedside.  Received verbal permission to discuss medical information with mother.  Discussed results of workup so far.  CT chest and CXR negative for acute findings such as pneumonia, pleural effusions, pulmonary edema, mediastinitis, pericarditis, pneumothorax, rib fracture, or cardiomegaly.  ABCs intact.  Afebrile.  No meningismus.  No emergent findings to explain prolonged cough or right sided chest wall pain.  I suspect this may be due to muscle spasm or soreness associated with prolonged cough, and may be working together.  Noted improvement of perceived wheeze upon arrival with use of albuterol inhaler.  History, exam, and presentation suggestion of postviral cough syndrome with possible early acute bronchitis.  No known hx of asthma or COPD, though with known hx of eczema.  Low suspicion for ACS or meningitis at this time. --While discussing these results, patient became tearful.  Pt stated "I want to know what's wrong with me".  We discussed these results in further depth,  and I shared my recommendations for following up with pulmonology for further evaluation, continuing use of albuterol as needed with short course of prednisone dose pak, and following up with PCP for overall coordination of care/management.  Follow up strongly encouraged with consideration of mild basilar atelectasis possibly seen on CT imaging.  Unsure whether this is acute or chronic.  Again, pt does not appear in respiratory distress and satting at 100% on room air.  Lungs CTAB.  Communicating well at this time.  Pt still has not provided urine for urinalysis, and this was discussed.  Pt then stated "I want to go home".  Pt proceeded to remove electrodes, blood pressure cuff, and pulse oximetry monitor, and gather her things.  Though pt has still not provided urinalysis, without urinary symptoms and doubt UTI, renal calculi, acute cystitis, or pyelonephritis at this time.  Does not meet SIRS or sepsis criteria.  Recommend further addressing urinary concerns with PCP if does not wish to address this in ED today.   --Shared decision making with pt.  Overall, I am uncertain the exact etiology of the patient's symptoms.  However, I do not believe she is currently experiencing a medical, surgical, or psychiatric emergency.  Pt wishes to leave.  I find this reasonable.  Strict return precautions discussed.  Encouraged to return to ED for any new or worsening symptoms at any time.  Pt in NAD and in good condition at time of discharge.     This chart was dictated using voice recognition software.  Despite best efforts to proofread, errors can occur which can change the documentation meaning.    Cecil Cobbs, PA-C 07/28/22 2671    Bethann Berkshire, MD 07/29/22 1050

## 2022-07-26 NOTE — ED Provider Notes (Signed)
[facility]-EMERGENCY DEPT Provider Note   CSN: [id number] Arrival date & time: 07/26/22  1019     History {Add pertinent medical, surgical, social history, OB history to HPI:1} Chief Complaint  Patient presents with   rib cage pain   Cough    Admire Bunnell is a 26 y.o. female with past medical history significant for eczema, hypothyroidism, obesity who presents with concern for right-sided rib pain/flank pain after having a cough since August after a cold, reports that she has had some intermittent black specks in her sputum, additionally had some protein in her urine, feels like she is seeing white in the toilet bowl, this is all been happening for months, she reports that the difficulty breathing is somewhat worse.  Patient denies any central or left-sided chest pain, fever, chills, nausea, vomiting, abdominal pain.   Cough Associated symptoms: shortness of breath        Home Medications Prior to Admission medications   Medication Sig Start Date End Date Taking? Authorizing Provider  acetaminophen (TYLENOL) 325 MG tablet Take 2 tablets (650 mg total) by mouth every 4 (four) hours as needed (for pain scale < 4). 11/20/20   Venora Maples, MD  amLODipine (NORVASC) 5 MG tablet Take 1 tablet (5 mg total) by mouth daily. 11/21/20   Venora Maples, MD  amLODipine (NORVASC) 5 MG tablet TAKE 1 TABLET (5 MG TOTAL) BY MOUTH DAILY. 11/20/20 11/20/21  Venora Maples, MD  ibuprofen (ADVIL) 600 MG tablet Take 1 tablet (600 mg total) by mouth every 6 (six) hours. 11/20/20   Venora Maples, MD  polyethylene glycol powder (GLYCOLAX/MIRALAX) 17 GM/SCOOP powder Take 17 g by mouth daily as needed. 11/20/20   Venora Maples, MD  Prenatal Vit-Fe Fumarate-FA (PRENATAL VITAMIN PO) Take 1 tablet by mouth daily.    [provider]      Allergies    Patient has no known allergies.    Review of Systems   Review of Systems  Respiratory:  Positive for cough  and shortness of breath.   All other systems reviewed and are negative.   Physical Exam Updated Vital Signs BP 130/80 (BP Location: Right Arm)   Pulse 76   Temp 98.2 F (36.8 C) (Oral)   Resp 18   Ht 5' 5.5" (1.664 m)   Wt 95.3 kg   LMP 07/17/2022   SpO2 92%   BMI 34.41 kg/m  Physical Exam Vitals and nursing note reviewed.  Constitutional:      General: She is not in acute distress.    Appearance: Normal appearance.  HENT:     Head: Normocephalic and atraumatic.  Eyes:     General:        Right eye: No discharge.        Left eye: No discharge.  Cardiovascular:     Rate and Rhythm: Normal rate and regular rhythm.     Heart sounds: No murmur heard.    No friction rub. No gallop.  Pulmonary:     Effort: Pulmonary effort is normal.     Breath sounds: Normal breath sounds.     Comments: Minimal bibasilar and expiratory wheezing, no significant respiratory distress, no wheezing, rhonchi, stridor, rales Abdominal:     General: Bowel sounds are normal.     Palpations: Abdomen is soft.  Skin:    General: Skin is warm and dry.     Capillary Refill: Capillary refill takes less than 2 seconds.  Neurological:  Mental Status: She is alert and oriented to person, place, and time.  Psychiatric:        Mood and Affect: Mood normal.        Behavior: Behavior normal.     ED Results / Procedures / Treatments   Labs (all labs ordered are listed, but only abnormal results are displayed) Labs Reviewed  CBC  COMPREHENSIVE METABOLIC PANEL  URINALYSIS, ROUTINE W REFLEX MICROSCOPIC  I-STAT BETA HCG BLOOD, ED ([facility], [facility], [facility] ONLY)    EKG None  Radiology DG Chest 2 View  Result Date: 07/26/2022 CLINICAL DATA:  Patient reported that she has been having a productive cough since the end of August with no improvement. Patient stated she has been having "black specks in her sputum".cough, sputum EXAM: CHEST - 2 VIEW COMPARISON:  None Available. FINDINGS: Normal mediastinum and cardiac  silhouette. Normal pulmonary vasculature. No evidence of effusion, infiltrate, or pneumothorax. No acute bony abnormality. IMPRESSION: No acute cardiopulmonary process. Electronically Signed   By: Genevive Bi M.D.   On: 07/26/2022 13:27   DG Chest 2 View  Result Date: 07/25/2022 CLINICAL DATA:  Cough EXAM: CHEST - 2 VIEW COMPARISON:  None Available. FINDINGS: The heart size and mediastinal contours are within normal limits. Both lungs are clear. The visualized skeletal structures are unremarkable. IMPRESSION: No active cardiopulmonary disease. Electronically Signed   By: Ernie Avena M.D.   On: 07/25/2022 12:44    Procedures Procedures  {Document cardiac monitor, telemetry assessment procedure when appropriate:1}  Medications Ordered in ED Medications  albuterol (VENTOLIN HFA) 108 (90 Base) MCG/ACT inhaler 1 puff (has no administration in time range)    ED Course/ Medical Decision Making/ A&P                           Medical Decision Making Amount and/or Complexity of Data Reviewed Labs: ordered. Radiology: ordered.  Risk Prescription drug management.   This patient is a 26 y.o. female who presents to the ED for concern of ***, this involves an extensive number of treatment options, and is a complaint that carries with it a high risk of complications and morbidity. The emergent differential diagnosis prior to evaluation includes, but is not limited to,  *** .   This is not an exhaustive differential.   Past Medical History / Co-morbidities / Social History: ***  Additional history: Chart reviewed. Pertinent results include: ***  Physical Exam: Physical exam performed. The pertinent findings include: ***  Lab Tests: I ordered, and personally interpreted labs.  The pertinent results include:  ***   Imaging Studies: I ordered imaging studies including ***. I independently visualized and interpreted imaging which showed ***. I agree with the radiologist  interpretation.   Cardiac Monitoring:  The patient was maintained on a cardiac monitor.  My attending physician Dr. Marland Kitchen viewed and interpreted the cardiac monitored which showed an underlying rhythm of: ***. I agree with this interpretation.   Medications: I ordered medication including ***  for ***. Reevaluation of the patient after these medicines showed that the patient {resolved/improved/worsened:23923::"improved"}. I have reviewed the patients home medicines and have made adjustments as needed.  Consultations Obtained: I requested consultation with the ***,  and discussed lab and imaging findings as well as pertinent plan - they recommend: ***   Disposition: After consideration of the diagnostic results and the patients response to treatment, I feel that *** .   ***emergency department workup does not suggest an emergent  condition requiring admission or immediate intervention beyond what has been performed at this time. The plan is: ***. The patient is safe for discharge and has been instructed to return immediately for worsening symptoms, change in symptoms or any other concerns.  I discussed this case with my attending physician Dr. Marland Kitchen who cosigned this note including patient's presenting symptoms, physical exam, and planned diagnostics and interventions. Attending physician stated agreement with plan or made changes to plan which were implemented.    Final Clinical Impression(s) / ED Diagnoses Final diagnoses:  None    Rx / DC Orders ED Discharge Orders     None

## 2022-08-14 ENCOUNTER — Encounter: Payer: Self-pay | Admitting: Pulmonary Disease

## 2022-08-14 ENCOUNTER — Ambulatory Visit (INDEPENDENT_AMBULATORY_CARE_PROVIDER_SITE_OTHER): Payer: Medicaid Other | Admitting: Pulmonary Disease

## 2022-08-14 VITALS — BP 110/88 | HR 84 | Temp 98.8°F | Ht 65.0 in | Wt 211.8 lb

## 2022-08-14 DIAGNOSIS — R0789 Other chest pain: Secondary | ICD-10-CM | POA: Diagnosis not present

## 2022-08-14 NOTE — Progress Notes (Signed)
Alicia Turner    [id number]    [birth year]  Primary Care Physician:Patient, No Pcp Per  Referring Physician: No referring provider defined for this encounter.  Chief complaint:   Chest discomfort Right lung pains  HPI:  Continues to have some discomfort with right side of her chest following a respiratory infection when she was coughing bringing up phlegm, having wheezing Was evaluated in the emergency department with blood work, chest x-ray, CT scan of the chest -Findings were essentially negative  She feels the chest discomfort is getting a little bit better but still concerned that she does not have any answers to what is going on with her overall  She did show me a picture of the sputum she was coughing up with some flecks in it  No underlying history of lung disease Never smoked Used weed in the past as and for the last 7 months  Albuterol did not really help symptoms  She feels her breathing feels better but still has persistent pain and discomfort  Family meds but is about cancer -I did reassure her that the CAT scan is not showing any signs of this, no growth in the lung  She did work as a Lawyer, recently does driving deliveries  No fevers, no chills Cough is a lot better  Outpatient Encounter Medications as of 08/14/2022  Medication Sig   acetaminophen (TYLENOL) 325 MG tablet Take 2 tablets (650 mg total) by mouth every 4 (four) hours as needed (for pain scale < 4).   albuterol (VENTOLIN HFA) 108 (90 Base) MCG/ACT inhaler Inhale 1-2 puffs into the lungs every 6 (six) hours as needed for wheezing or shortness of breath.   amLODipine (NORVASC) 5 MG tablet Take 1 tablet (5 mg total) by mouth daily. (Patient not taking: Reported on 08/14/2022)   amLODipine (NORVASC) 5 MG tablet TAKE 1 TABLET (5 MG TOTAL) BY MOUTH DAILY.   ibuprofen (ADVIL) 600 MG tablet Take 1 tablet (600 mg total) by mouth every 6 (six) hours.   polyethylene glycol powder (GLYCOLAX/MIRALAX) 17  GM/SCOOP powder Take 17 g by mouth daily as needed.   predniSONE (STERAPRED UNI-PAK 21 TAB) 10 MG (21) TBPK tablet Take by mouth daily. Take 6 tabs by mouth daily  for 2 days, then 5 tabs for 2 days, then 4 tabs for 2 days, then 3 tabs for 2 days, 2 tabs for 2 days, then 1 tab by mouth daily for 2 days (Patient not taking: Reported on 08/14/2022)   Prenatal Vit-Fe Fumarate-FA (PRENATAL VITAMIN PO) Take 1 tablet by mouth daily. (Patient not taking: Reported on 08/14/2022)   No facility-administered encounter medications on file as of 08/14/2022.    Allergies as of 08/14/2022   (No Known Allergies)    Past Medical History:  Diagnosis Date   Eczema    Hyperthyroidism     Past Surgical History:  Procedure Laterality Date   BREAST BIOPSY Bilateral    NO PAST SURGERIES      Family History  Problem Relation Age of Onset   Arthritis Mother        rheumatoid   Hyperlipidemia Mother    Healthy Father     Social History   Socioeconomic History   Marital status: Single    Spouse name: Not on file   Number of children: Not on file   Years of education: Not on file   Highest education level: Not on file  Occupational History  Not on file  Tobacco Use   Smoking status: Never   Smokeless tobacco: Never  Vaping Use   Vaping Use: Never used  Substance and Sexual Activity   Alcohol use: Yes    Comment: occasionally   Drug use: Never   Sexual activity: Yes    Birth control/protection: None  Other Topics Concern   Not on file  Social History Narrative   Not on file   Social Determinants of Health   Financial Resource Strain: Not on file  Food Insecurity: Not on file  Transportation Needs: No Transportation Needs (06/15/2020)   PRAPARE - Administrator, Civil Service (Medical): No    Lack of Transportation (Non-Medical): No  Physical Activity: Not on file  Stress: Not on file  Social Connections: Not on file  Intimate Partner Violence: Not on file    Review of  Systems  Constitutional:  Positive for fatigue.  Respiratory:  Positive for chest tightness.     Vitals:   08/14/22 1328  BP: 110/88  Pulse: 84  Temp: 98.8 F (37.1 C)  SpO2: 100%     Physical Exam Constitutional:      Appearance: She is obese.  HENT:     Head: Normocephalic.     Mouth/Throat:     Mouth: Mucous membranes are moist.  Cardiovascular:     Rate and Rhythm: Normal rate and regular rhythm.     Heart sounds: No murmur heard.    No friction rub.  Pulmonary:     Effort: No respiratory distress.     Breath sounds: No stridor. No wheezing or rhonchi.  Musculoskeletal:     Cervical back: No rigidity or tenderness.  Neurological:     Mental Status: She is alert.  Psychiatric:        Mood and Affect: Mood normal.      Data Reviewed: CT scan of the chest reviewed with the patient  Blood work reviewed   Assessment:  Chest wall pain  Musculoskeletal pain and discomfort  Cough/wheezing -This may have been related to respiratory tract infection that appears to be resolving/resolved -CT does not show any significant infiltrates  Inhaler technique was reviewed-was good  Plan/Recommendations: Continue inhaler as needed  I did try to reassure her that from reviewing labs, CT scan-I cannot find any significant pathology along with the lungs  For musculoskeletal pain -Pain management, over-the-counter medications like Tylenol, Motrin as needed  She is still quite concerned that something is going on which unfortunately, I am not able to decipher at the present time  I offered if she would like another opinion from another provider, she stated she has seen multiple people that have not been able to help her  I will see her as needed    Virl Diamond MD [facility] 08/14/2022, 1:43 PM  CC: No ref. provider found

## 2022-08-14 NOTE — Patient Instructions (Addendum)
I will see you as needed  I hope you continue to feel better  I did not see anything on your CT scan to be concerned about Your blood counts also did not show any evidence of ongoing infections  Over-the-counter pain medications should help  Rescue inhaler use as needed   Your past history also does not reflect underlying lung disease

## 2023-06-13 LAB — OB RESULTS CONSOLE HIV ANTIBODY (ROUTINE TESTING): HIV: NONREACTIVE

## 2023-06-13 LAB — OB RESULTS CONSOLE RUBELLA ANTIBODY, IGM: Rubella: IMMUNE

## 2023-06-13 LAB — HEMOGLOBIN A1C: Hemoglobin A1C: 5.3

## 2023-06-13 LAB — HEPATITIS C ANTIBODY: HCV Ab: NEGATIVE

## 2023-06-13 LAB — OB RESULTS CONSOLE HEPATITIS B SURFACE ANTIGEN: Hepatitis B Surface Ag: NEGATIVE

## 2023-06-13 LAB — OB RESULTS CONSOLE RPR: RPR: NONREACTIVE

## 2023-07-24 LAB — OB RESULTS CONSOLE HGB/HCT, BLOOD
HCT: 37 (ref 29–41)
Hemoglobin: 12.7

## 2023-07-24 LAB — GLUCOSE TOLERANCE, 1 HOUR: Glucose, 1 Hour GTT: 72

## 2023-07-24 LAB — OB RESULTS CONSOLE GC/CHLAMYDIA
Chlamydia: NEGATIVE
Neisseria Gonorrhea: NEGATIVE

## 2023-08-12 LAB — AFP, SERUM, OPEN SPINA BIFIDA: AFP: NEGATIVE

## 2023-08-20 DIAGNOSIS — O99210 Obesity complicating pregnancy, unspecified trimester: Secondary | ICD-10-CM | POA: Insufficient documentation

## 2023-08-20 DIAGNOSIS — O09892 Supervision of other high risk pregnancies, second trimester: Secondary | ICD-10-CM | POA: Insufficient documentation

## 2023-08-20 DIAGNOSIS — E0590 Thyrotoxicosis, unspecified without thyrotoxic crisis or storm: Secondary | ICD-10-CM | POA: Insufficient documentation

## 2023-08-21 ENCOUNTER — Other Ambulatory Visit: Payer: Self-pay

## 2023-09-01 ENCOUNTER — Ambulatory Visit: Payer: Medicaid Other | Admitting: *Deleted

## 2023-09-01 ENCOUNTER — Other Ambulatory Visit: Payer: Self-pay | Admitting: *Deleted

## 2023-09-01 ENCOUNTER — Other Ambulatory Visit: Payer: Self-pay

## 2023-09-01 ENCOUNTER — Ambulatory Visit: Payer: Medicaid Other | Attending: Family Medicine

## 2023-09-01 ENCOUNTER — Encounter: Payer: Self-pay | Admitting: *Deleted

## 2023-09-01 ENCOUNTER — Other Ambulatory Visit: Payer: Self-pay | Admitting: Family Medicine

## 2023-09-01 VITALS — BP 111/69 | HR 74

## 2023-09-01 DIAGNOSIS — Z8639 Personal history of other endocrine, nutritional and metabolic disease: Secondary | ICD-10-CM

## 2023-09-01 DIAGNOSIS — O99212 Obesity complicating pregnancy, second trimester: Secondary | ICD-10-CM

## 2023-09-01 DIAGNOSIS — O99282 Endocrine, nutritional and metabolic diseases complicating pregnancy, second trimester: Secondary | ICD-10-CM | POA: Diagnosis not present

## 2023-09-01 DIAGNOSIS — O09892 Supervision of other high risk pregnancies, second trimester: Secondary | ICD-10-CM | POA: Diagnosis present

## 2023-09-01 DIAGNOSIS — Z141 Cystic fibrosis carrier: Secondary | ICD-10-CM | POA: Diagnosis present

## 2023-09-01 DIAGNOSIS — E0590 Thyrotoxicosis, unspecified without thyrotoxic crisis or storm: Secondary | ICD-10-CM

## 2023-09-01 DIAGNOSIS — O99210 Obesity complicating pregnancy, unspecified trimester: Secondary | ICD-10-CM | POA: Diagnosis present

## 2023-09-01 DIAGNOSIS — O285 Abnormal chromosomal and genetic finding on antenatal screening of mother: Secondary | ICD-10-CM

## 2023-09-01 DIAGNOSIS — E669 Obesity, unspecified: Secondary | ICD-10-CM | POA: Diagnosis not present

## 2023-09-01 DIAGNOSIS — Z3A17 17 weeks gestation of pregnancy: Secondary | ICD-10-CM

## 2023-09-01 DIAGNOSIS — O99280 Endocrine, nutritional and metabolic diseases complicating pregnancy, unspecified trimester: Secondary | ICD-10-CM | POA: Diagnosis present

## 2023-09-23 LAB — OB RESULTS CONSOLE GC/CHLAMYDIA
Chlamydia: NEGATIVE
Neisseria Gonorrhea: NEGATIVE

## 2023-10-06 ENCOUNTER — Ambulatory Visit: Payer: Medicaid Other | Attending: Obstetrics and Gynecology

## 2023-10-06 ENCOUNTER — Other Ambulatory Visit: Payer: Self-pay | Admitting: *Deleted

## 2023-10-06 DIAGNOSIS — Z148 Genetic carrier of other disease: Secondary | ICD-10-CM

## 2023-10-06 DIAGNOSIS — E0590 Thyrotoxicosis, unspecified without thyrotoxic crisis or storm: Secondary | ICD-10-CM | POA: Diagnosis not present

## 2023-10-06 DIAGNOSIS — O99212 Obesity complicating pregnancy, second trimester: Secondary | ICD-10-CM | POA: Insufficient documentation

## 2023-10-06 DIAGNOSIS — O285 Abnormal chromosomal and genetic finding on antenatal screening of mother: Secondary | ICD-10-CM

## 2023-10-06 DIAGNOSIS — O99282 Endocrine, nutritional and metabolic diseases complicating pregnancy, second trimester: Secondary | ICD-10-CM | POA: Diagnosis not present

## 2023-10-06 DIAGNOSIS — E669 Obesity, unspecified: Secondary | ICD-10-CM

## 2023-10-06 DIAGNOSIS — Z3A22 22 weeks gestation of pregnancy: Secondary | ICD-10-CM

## 2023-10-06 DIAGNOSIS — O99283 Endocrine, nutritional and metabolic diseases complicating pregnancy, third trimester: Secondary | ICD-10-CM

## 2023-10-08 NOTE — L&D Delivery Note (Addendum)
 OB/GYN Faculty Practice Delivery Note  Alicia Turner is a 28 y.o. [id number] s/p NSVD at [redacted]w[redacted]d. She was admitted for IOL d/t oligo   ROM: 2h 67m with clear fluid GBS Status:  Negative/-- (04/16 1152) Maximum Maternal Temperature: 98.3  Labor Progress: Initial SVE: 2/50/-2, foley balloon was placed on admission, she then had AROM and pitocin . She then progressed to complete.   Delivery Date/Time: 01/26/2024 @ 2340 Delivery: Called to room and patient was complete and pushing. Head delivered ROA. No nuchal cord present. Shoulder and body delivered in usual fashion. Infant with spontaneous cry, placed on mother's abdomen, dried and stimulated. Cord clamped x 2 after cessation of pulsation, and cut by friend of family. Cord blood drawn. Placenta delivered spontaneously with gentle cord traction. Fundus firm with massage and Pitocin . Labia, perineum, vagina, and cervix inspected inspected with no lacerations.  Baby Weight: pending  Placenta: Sent home with patient Complications: None Lacerations: none EBL: 127 mL Analgesia: Epidural   Infant:  APGAR (1 MIN): 7  APGAR (5 MINS): 9  APGAR (10 MINS):     Rayma Calandra, DO Family Medicine Resident 01/27/2024, 12:00 AM   Attestation:  I confirm that I have verified the information documented in the resident's note and that I have also personally reperformed the physical exam and all medical decision making activities.   I was gloved and present for entire delivery SVD without incident No difficulty with shoulders No lacerations  Harlee Lichtenstein, CNM

## 2023-10-31 ENCOUNTER — Encounter: Payer: Self-pay | Admitting: Obstetrics & Gynecology

## 2023-10-31 ENCOUNTER — Other Ambulatory Visit: Payer: Self-pay

## 2023-10-31 ENCOUNTER — Ambulatory Visit (INDEPENDENT_AMBULATORY_CARE_PROVIDER_SITE_OTHER): Payer: Medicaid Other | Admitting: Obstetrics & Gynecology

## 2023-10-31 VITALS — BP 121/75 | HR 105 | Wt 211.7 lb

## 2023-10-31 DIAGNOSIS — Z141 Cystic fibrosis carrier: Secondary | ICD-10-CM

## 2023-10-31 DIAGNOSIS — O09892 Supervision of other high risk pregnancies, second trimester: Secondary | ICD-10-CM

## 2023-10-31 DIAGNOSIS — O0992 Supervision of high risk pregnancy, unspecified, second trimester: Secondary | ICD-10-CM

## 2023-10-31 DIAGNOSIS — O99282 Endocrine, nutritional and metabolic diseases complicating pregnancy, second trimester: Secondary | ICD-10-CM

## 2023-10-31 DIAGNOSIS — O99212 Obesity complicating pregnancy, second trimester: Secondary | ICD-10-CM | POA: Diagnosis not present

## 2023-10-31 DIAGNOSIS — Z3A26 26 weeks gestation of pregnancy: Secondary | ICD-10-CM

## 2023-10-31 DIAGNOSIS — O99210 Obesity complicating pregnancy, unspecified trimester: Secondary | ICD-10-CM

## 2023-10-31 DIAGNOSIS — E0590 Thyrotoxicosis, unspecified without thyrotoxic crisis or storm: Secondary | ICD-10-CM

## 2023-10-31 DIAGNOSIS — Z1332 Encounter for screening for maternal depression: Secondary | ICD-10-CM | POA: Diagnosis not present

## 2023-10-31 DIAGNOSIS — O0990 Supervision of high risk pregnancy, unspecified, unspecified trimester: Secondary | ICD-10-CM | POA: Insufficient documentation

## 2023-10-31 NOTE — Progress Notes (Signed)
Subjective:referred from [facility] for h/o hyperthyroidism    Alicia Turner is a [id number] [redacted]w[redacted]d being seen today for her first obstetrical visit.  Her obstetrical history is significant for  subclinical hyperthyroid . Patient does intend to breast feed. Pregnancy history fully reviewed.  Patient reports no complaints.  Vitals:   10/31/23 1019  BP: 121/75  Pulse: (!) 105  Weight: 211 lb 11.2 oz (96 kg)    HISTORY: OB History  Gravida Para Term Preterm AB Living  5 2 2  0 2 2  SAB IAB Ectopic Multiple Live Births  2    2    # Outcome Date GA Lbr Len/2nd Weight Sex Type Anes PTL Lv  5 Current           4 Term 11/18/20     Vag-Spont   LIV  3 SAB 09/2019          2 Term 12/20/17    M Vag-Spont  N LIV  1 SAB            Past Medical History:  Diagnosis Date   Eczema    Hyperthyroidism    Non-reassuring fetal heart rate or rhythm affecting mother 11/17/2020   Past Surgical History:  Procedure Laterality Date   BREAST BIOPSY Bilateral    NO PAST SURGERIES     Family History  Problem Relation Age of Onset   Arthritis Mother        rheumatoid   Hyperlipidemia Mother    Healthy Father      Exam    Uterus:   26 cm  Pelvic Exam: deferred   Perineum:    Vulva:    Vagina:     pH:    Cervix:    Adnexa:    Bony Pelvis:   System: Breast:     Skin: normal coloration and turgor, no rashes    Neurologic: oriented, normal mood   Extremities: normal strength, tone, and muscle mass   HEENT No exophthalmos   Mouth/Teeth mucous membranes moist, pharynx normal without lesions   Neck supple   Cardiovascular: regular rate and rhythm   Respiratory:  appears well, vitals normal, no respiratory distress, acyanotic, normal RR   Abdomen: gravid   Urinary:       Assessment:    Pregnancy: [id number] Patient Active Problem List   Diagnosis Date Noted   Supervision of high risk pregnancy, antepartum 10/31/2023   Hyperthyroidism affecting pregnancy, antepartum 08/20/2023   Obesity  affecting pregnancy, antepartum 08/20/2023   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 Prenatal vitamins. Problem list reviewed and updated. Genetic Screening discussed : results reviewed.  Ultrasound discussed; fetal survey: results reviewed.  Follow up in 2 weeks. 50% of 30 min visit spent on counseling and coordination of care.  Orders Placed This Encounter  Procedures   CBC    Standing Status:   Future    Expected Date:   12/01/2023    Expiration Date:   10/30/2024   Glucose Tolerance, 2 Hours w/1 Hour    Standing Status:   Future    Expected Date:   12/01/2023    Expiration Date:   10/30/2024   HIV Antibody (routine testing w rflx)    Standing Status:   Future    Expected Date:   12/01/2023    Expiration Date:   10/30/2024   RPR    Standing Status:   Future  Expected Date:   12/01/2023    Expiration Date:   10/30/2024   Thyroid Panel With TSH      Scheryl Darter 10/31/2023

## 2023-11-01 LAB — THYROID PANEL WITH TSH
Free Thyroxine Index: 1.3 (ref 1.2–4.9)
T3 Uptake Ratio: 14 % — ABNORMAL LOW (ref 24–39)
T4, Total: 9.6 ug/dL (ref 4.5–12.0)
TSH: 0.638 u[IU]/mL (ref 0.450–4.500)

## 2023-11-12 ENCOUNTER — Telehealth: Payer: Self-pay | Admitting: Family Medicine

## 2023-11-12 ENCOUNTER — Encounter (HOSPITAL_COMMUNITY): Payer: Self-pay | Admitting: Obstetrics and Gynecology

## 2023-11-12 ENCOUNTER — Inpatient Hospital Stay (HOSPITAL_COMMUNITY)
Admission: AD | Admit: 2023-11-12 | Discharge: 2023-11-12 | Disposition: A | Discharge status: Home / Self Care | Payer: Medicaid Other | Attending: Obstetrics and Gynecology | Admitting: Obstetrics and Gynecology

## 2023-11-12 ENCOUNTER — Inpatient Hospital Stay (HOSPITAL_COMMUNITY): Payer: Medicaid Other

## 2023-11-12 DIAGNOSIS — O285 Abnormal chromosomal and genetic finding on antenatal screening of mother: Secondary | ICD-10-CM

## 2023-11-12 DIAGNOSIS — Z3A27 27 weeks gestation of pregnancy: Secondary | ICD-10-CM

## 2023-11-12 DIAGNOSIS — Z872 Personal history of diseases of the skin and subcutaneous tissue: Secondary | ICD-10-CM | POA: Insufficient documentation

## 2023-11-12 DIAGNOSIS — O99212 Obesity complicating pregnancy, second trimester: Secondary | ICD-10-CM | POA: Diagnosis not present

## 2023-11-12 DIAGNOSIS — E0590 Thyrotoxicosis, unspecified without thyrotoxic crisis or storm: Secondary | ICD-10-CM | POA: Diagnosis not present

## 2023-11-12 DIAGNOSIS — O468X9 Other antepartum hemorrhage, unspecified trimester: Secondary | ICD-10-CM | POA: Insufficient documentation

## 2023-11-12 DIAGNOSIS — O99342 Other mental disorders complicating pregnancy, second trimester: Secondary | ICD-10-CM | POA: Diagnosis not present

## 2023-11-12 DIAGNOSIS — Z141 Cystic fibrosis carrier: Secondary | ICD-10-CM

## 2023-11-12 DIAGNOSIS — F419 Anxiety disorder, unspecified: Secondary | ICD-10-CM | POA: Diagnosis not present

## 2023-11-12 DIAGNOSIS — O4692 Antepartum hemorrhage, unspecified, second trimester: Secondary | ICD-10-CM

## 2023-11-12 DIAGNOSIS — O0990 Supervision of high risk pregnancy, unspecified, unspecified trimester: Secondary | ICD-10-CM

## 2023-11-12 DIAGNOSIS — Z3689 Encounter for other specified antenatal screening: Secondary | ICD-10-CM

## 2023-11-12 DIAGNOSIS — Z3492 Encounter for supervision of normal pregnancy, unspecified, second trimester: Secondary | ICD-10-CM

## 2023-11-12 DIAGNOSIS — O99282 Endocrine, nutritional and metabolic diseases complicating pregnancy, second trimester: Secondary | ICD-10-CM | POA: Diagnosis not present

## 2023-11-12 DIAGNOSIS — E669 Obesity, unspecified: Secondary | ICD-10-CM

## 2023-11-12 LAB — URINALYSIS, ROUTINE W REFLEX MICROSCOPIC
Bilirubin Urine: NEGATIVE
Glucose, UA: NEGATIVE mg/dL
Ketones, ur: NEGATIVE mg/dL
Leukocytes,Ua: NEGATIVE
Nitrite: NEGATIVE
Protein, ur: NEGATIVE mg/dL
Specific Gravity, Urine: 1.025 (ref 1.005–1.030)
pH: 6 (ref 5.0–8.0)

## 2023-11-12 LAB — CBC WITH DIFFERENTIAL/PLATELET
Abs Immature Granulocytes: 0.06 10*3/uL (ref 0.00–0.07)
Basophils Absolute: 0 10*3/uL (ref 0.0–0.1)
Basophils Relative: 0 %
Eosinophils Absolute: 0.1 10*3/uL (ref 0.0–0.5)
Eosinophils Relative: 1 %
HCT: 31.2 % — ABNORMAL LOW (ref 36.0–46.0)
Hemoglobin: 10.7 g/dL — ABNORMAL LOW (ref 12.0–15.0)
Immature Granulocytes: 1 %
Lymphocytes Relative: 25 %
Lymphs Abs: 2.3 10*3/uL (ref 0.7–4.0)
MCH: 29.6 pg (ref 26.0–34.0)
MCHC: 34.3 g/dL (ref 30.0–36.0)
MCV: 86.4 fL (ref 80.0–100.0)
Monocytes Absolute: 0.7 10*3/uL (ref 0.1–1.0)
Monocytes Relative: 8 %
Neutro Abs: 6 10*3/uL (ref 1.7–7.7)
Neutrophils Relative %: 65 %
Platelets: 228 10*3/uL (ref 150–400)
RBC: 3.61 MIL/uL — ABNORMAL LOW (ref 3.87–5.11)
RDW: 12.8 % (ref 11.5–15.5)
WBC: 9.1 10*3/uL (ref 4.0–10.5)
nRBC: 0 % (ref 0.0–0.2)

## 2023-11-12 LAB — WET PREP, GENITAL
Clue Cells Wet Prep HPF POC: NONE SEEN
Sperm: NONE SEEN
Trich, Wet Prep: NONE SEEN
WBC, Wet Prep HPF POC: <10 (ref <10)
Yeast Wet Prep HPF POC: NONE SEEN

## 2023-11-12 LAB — ABO/RH: ABO/RH(D): O POS

## 2023-11-12 LAB — URINALYSIS, MICROSCOPIC (REFLEX)

## 2023-11-12 LAB — KLEIHAUER-BETKE STAIN
Fetal Cells %: 0 %
Quantitation Fetal Hemoglobin: 0 mL

## 2023-11-12 MED ORDER — HYDROXYZINE HCL 25 MG PO TABS
25.0000 mg | ORAL_TABLET | ORAL | Status: AC
  Administered 2023-11-12: 25 mg via ORAL
  Filled 2023-11-12: qty 1

## 2023-11-12 MED ORDER — HYDROXYZINE HCL 25 MG PO TABS: 25.0000 mg | ORAL_TABLET | Freq: Every day | ORAL | 0 refills | Status: DC

## 2023-11-12 NOTE — Discharge Instructions (Signed)
 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 We monitored your baby, got an ultrasound, and draw labs. Your baby was reassuring on all our monitoring and you are stable. It is advised that you have follow up with MFM. You have this and an OB appointment scheduled 11/17/23.  They will follow you and make sure you have any further appropriate testing completed.   Thank you for trusting us  to care for you, Camie, Midwife

## 2023-11-12 NOTE — MAU Provider Note (Signed)
 Chief Complaint:  Vaginal Bleeding   HPI   None     Alicia Turner is a 28 y.o. [id number] at [redacted]w[redacted]d who presents to maternity admissions reporting scant vaginal bleeding which started Sunday (11/09/23) night.  She reports that the bleeding stopped yesterday and was light in amount and was dark brown/reddish. She reports no pain and saw no clots. Patient endorses anxiety.   Pregnancy Course: Hx of ABO isoimmunization with previous pregnancy, Hyperthyroidism, CF carrier  Past Medical History:  Diagnosis Date   Anemia    Eczema    Erythema nodosum    Hyperglycemia    Hyperthyroidism    Left breast lump    Non-reassuring fetal heart rate or rhythm affecting mother 11/17/2020   OB History  Gravida Para Term Preterm AB Living  6 2 2  0 3 2  SAB IAB Ectopic Multiple Live Births  3    2    # Outcome Date GA Lbr Len/2nd Weight Sex Type Anes PTL Lv  6 Current           5 Term 11/18/20 [redacted]w[redacted]d  3147 g M Vag-Spont   LIV  4 SAB 10/03/19 [redacted]w[redacted]d         3 Term 12/20/17 [redacted]w[redacted]d  3062 g M Vag-Spont EPI N LIV  2 SAB           1 SAB            Past Surgical History:  Procedure Laterality Date   BREAST BIOPSY Right 06/16/2020   BREAST BIOPSY Left 2019   NO PAST SURGERIES     Family History  Problem Relation Age of Onset   Arthritis Mother        rheumatoid   Hypertension Mother    Hyperlipidemia Mother    Diabetes Mellitus II Mother    Healthy Father    Thyroid  cancer Maternal Aunt    Social History   Tobacco Use   Smoking status: Never   Smokeless tobacco: Never  Vaping Use   Vaping status: Never Used  Substance Use Topics   Alcohol use: Yes    Comment: occasionally   Drug use: Never   No Known Allergies No medications prior to admission.    I have reviewed patient's Past Medical Hx, Surgical Hx, Family Hx, Social Hx, medications and allergies.   ROS  Pertinent items noted in HPI and remainder of comprehensive ROS otherwise negative.   PHYSICAL EXAM  Patient Vitals for the  past 24 hrs:  BP Temp Temp src Pulse Resp SpO2  11/12/23 2125 122/69 -- -- 83 -- --  11/12/23 1735 126/67 -- -- 86 -- 99 %  11/12/23 1730 123/72 99.1 F (37.3 C) Oral 85 16 99 %    Physical Exam Vitals and nursing note reviewed.  Constitutional:      General: She is in acute distress.     Appearance: Normal appearance. She is normal weight. She is not ill-appearing, toxic-appearing or diaphoretic.  Cardiovascular:     Rate and Rhythm: Normal rate.  Pulmonary:     Effort: Pulmonary effort is normal.  Musculoskeletal:        General: Normal range of motion.  Skin:    General: Skin is warm and dry.     Capillary Refill: Capillary refill takes less than 2 seconds.  Neurological:     General: No focal deficit present.     Mental Status: She is alert and oriented to person, place, and time. Mental status is at  baseline.  Psychiatric:        Mood and Affect: Mood normal.        Behavior: Behavior normal.        Thought Content: Thought content normal.        Judgment: Judgment normal.      Fetal Tracing: Baseline: 140 Variability: moderate Accelerations: 15x15 Decelerations: variable occasional  Toco: quiet   Labs: Results for orders placed or performed during the hospital encounter of 11/12/23 (from the past 24 hours)  Urinalysis, Routine w reflex microscopic -Urine, Clean Catch     Status: Abnormal   Collection Time: 11/12/23  5:42 PM  Result Value Ref Range   Color, Urine YELLOW YELLOW   APPearance CLEAR CLEAR   Specific Gravity, Urine 1.025 1.005 - 1.030   pH 6.0 5.0 - 8.0   Glucose, UA NEGATIVE NEGATIVE mg/dL   Hgb urine dipstick SMALL (A) NEGATIVE   Bilirubin Urine NEGATIVE NEGATIVE   Ketones, ur NEGATIVE NEGATIVE mg/dL   Protein, ur NEGATIVE NEGATIVE mg/dL   Nitrite NEGATIVE NEGATIVE   Leukocytes,Ua NEGATIVE NEGATIVE  Urinalysis, Microscopic (reflex)     Status: Abnormal   Collection Time: 11/12/23  5:42 PM  Result Value Ref Range   RBC / HPF 6-10 0 - 5  RBC/hpf   WBC, UA 0-5 0 - 5 WBC/hpf   Bacteria, UA FEW (A) NONE SEEN   Squamous Epithelial / HPF 6-10 0 - 5 /HPF   Mucus PRESENT   Wet prep, genital     Status: None   Collection Time: 11/12/23  6:00 PM  Result Value Ref Range   Yeast Wet Prep HPF POC NONE SEEN NONE SEEN   Trich, Wet Prep NONE SEEN NONE SEEN   Clue Cells Wet Prep HPF POC NONE SEEN NONE SEEN   WBC, Wet Prep HPF POC <10 <10   Sperm NONE SEEN   CBC with Differential/Platelet     Status: Abnormal   Collection Time: 11/12/23  6:24 PM  Result Value Ref Range   WBC 9.1 4.0 - 10.5 K/uL   RBC 3.61 (L) 3.87 - 5.11 MIL/uL   Hemoglobin 10.7 (L) 12.0 - 15.0 g/dL   HCT 68.7 (L) 63.9 - 53.9 %   MCV 86.4 80.0 - 100.0 fL   MCH 29.6 26.0 - 34.0 pg   MCHC 34.3 30.0 - 36.0 g/dL   RDW 87.1 88.4 - 84.4 %   Platelets 228 150 - 400 K/uL   nRBC 0.0 0.0 - 0.2 %   Neutrophils Relative % 65 %   Neutro Abs 6.0 1.7 - 7.7 K/uL   Lymphocytes Relative 25 %   Lymphs Abs 2.3 0.7 - 4.0 K/uL   Monocytes Relative 8 %   Monocytes Absolute 0.7 0.1 - 1.0 K/uL   Eosinophils Relative 1 %   Eosinophils Absolute 0.1 0.0 - 0.5 K/uL   Basophils Relative 0 %   Basophils Absolute 0.0 0.0 - 0.1 K/uL   Immature Granulocytes 1 %   Abs Immature Granulocytes 0.06 0.00 - 0.07 K/uL  ABO/Rh     Status: None   Collection Time: 11/12/23  8:36 PM  Result Value Ref Range   ABO/RH(D) O POS    No rh immune globuloin      NOT A RH IMMUNE GLOBULIN CANDIDATE, PT RH POSITIVE Performed at [facility], [address], [place], [place] [address]     Imaging:  No results found.  MDM & MAU COURSE  MDM: Reassuring fetal status  Resolved vaginal bleeding  MAU Course: Orders Placed This Encounter  Procedures   Wet prep, genital   US  MFM OB LIMITED   Urinalysis, Routine w reflex microscopic -Urine, Clean Catch   CBC with Differential/Platelet   Kleihauer-Betke stain   Urinalysis, Microscopic (reflex)   ABO/Rh   Discharge patient   Meds ordered this  encounter  Medications   hydrOXYzine  (ATARAX ) tablet 25 mg   hydrOXYzine  (ATARAX ) 25 MG tablet    Sig: Take 1 tablet (25 mg total) by mouth at bedtime.    Dispense:  10 tablet    Refill:  0    Supervising Provider:   PRATT, TANYA S [[id number]]    ASSESSMENT   1. Non-stress test reactive   2. Movement of fetus present during pregnancy in second trimester   3. Subchorionic hemorrhage of placenta, antepartum   - KB pending. Drawn out of an abundance of caution/patient request due to last pregnancy. - ABO/Rh also drawn for antibodies. - Reassuring fetal status.  - Patient anxious.  - Small subchorionic hemorrhage noted.   PLAN  Discharge home in stable condition. Counseled on return recommendations.  - Patient has follow up appointment for MFM 11/17/23  - Limited Atarax  prescription sent for report of anxiety and poor sleep. - KB pending. Drawn out of an abundance of caution/patient request due to last pregnancy. - ABO/Rh also drawn for antibodies.  Allergies as of 11/12/2023   No Known Allergies      Medication List     STOP taking these medications    amLODipine  5 MG tablet Commonly known as: NORVASC    ibuprofen  600 MG tablet Commonly known as: ADVIL    predniSONE  10 MG (21) Tbpk tablet Commonly known as: STERAPRED UNI-PAK 21 TAB       TAKE these medications    acetaminophen  325 MG tablet Commonly known as: Tylenol  Take 2 tablets (650 mg total) by mouth every 4 (four) hours as needed (for pain scale < 4).   albuterol  108 (90 Base) MCG/ACT inhaler Commonly known as: VENTOLIN  HFA Inhale 1-2 puffs into the lungs every 6 (six) hours as needed for wheezing or shortness of breath.   hydrOXYzine  25 MG tablet Commonly known as: ATARAX  Take 1 tablet (25 mg total) by mouth at bedtime.   polyethylene glycol powder 17 GM/SCOOP powder Commonly known as: GLYCOLAX /MIRALAX  Take 17 g by mouth daily as needed.   PRENATAL VITAMIN PO Take 1 tablet by mouth daily.   sertraline  50 MG tablet Commonly known as: ZOLOFT Take 50 mg by mouth daily.   VITAMIN D PO Take by mouth.        Camie Rote, MSN, CNM 11/12/2023 9:48 PM  Certified Nurse Midwife, [facility]

## 2023-11-12 NOTE — MAU Note (Signed)
.  Alicia Turner is a 28 y.o. at [redacted]w[redacted]d here in MAU reporting: office instructed her to come in for evaluation of light bleeding that stopped yesterday. The bleeding lasted for about 31 hours and she has not had any since. The bleeding is described as light, brown/dark red. Denies any LOF or pain. Reports +FM.  LMP: N/A  Onset of complaint: 3 days ago Pain score: Denies pain Vitals:   11/12/23 1730  BP: 123/72  Pulse: 85  Resp: 16  Temp: 99.1 F (37.3 C)  SpO2: 99%     FHT: 145   Lab orders placed from triage: UA

## 2023-11-12 NOTE — Telephone Encounter (Signed)
 Patient called wanting next ob appointment to be moved sooner than 2/12. She says that she had some light bleeding for 1-2 days about 3 days ago. Patient reports that bleeding has stopped but she would like to come in earlier. I advised patient that she may want to go to the MAU. Patient says she has had bad experiences there and does not want to go. Ob appointment and glucose test was moved to next available on 11/17/23.

## 2023-11-13 LAB — GC/CHLAMYDIA PROBE AMP (~~LOC~~) NOT AT ARMC
Chlamydia: NEGATIVE
Comment: NEGATIVE
Comment: NORMAL
Neisseria Gonorrhea: NEGATIVE

## 2023-11-14 ENCOUNTER — Encounter: Payer: Self-pay | Admitting: Certified Nurse Midwife

## 2023-11-17 ENCOUNTER — Other Ambulatory Visit: Payer: Self-pay | Admitting: *Deleted

## 2023-11-17 ENCOUNTER — Ambulatory Visit (INDEPENDENT_AMBULATORY_CARE_PROVIDER_SITE_OTHER): Payer: Medicaid Other | Admitting: Obstetrics and Gynecology

## 2023-11-17 ENCOUNTER — Other Ambulatory Visit: Payer: Self-pay

## 2023-11-17 ENCOUNTER — Other Ambulatory Visit: Payer: Medicaid Other

## 2023-11-17 ENCOUNTER — Ambulatory Visit: Payer: Medicaid Other | Attending: Maternal & Fetal Medicine

## 2023-11-17 ENCOUNTER — Ambulatory Visit: Payer: Medicaid Other | Admitting: *Deleted

## 2023-11-17 VITALS — BP 118/64 | HR 83

## 2023-11-17 VITALS — BP 102/72 | HR 98 | Wt 212.6 lb

## 2023-11-17 DIAGNOSIS — O99013 Anemia complicating pregnancy, third trimester: Secondary | ICD-10-CM | POA: Diagnosis not present

## 2023-11-17 DIAGNOSIS — Z141 Cystic fibrosis carrier: Secondary | ICD-10-CM

## 2023-11-17 DIAGNOSIS — O0990 Supervision of high risk pregnancy, unspecified, unspecified trimester: Secondary | ICD-10-CM

## 2023-11-17 DIAGNOSIS — O0993 Supervision of high risk pregnancy, unspecified, third trimester: Secondary | ICD-10-CM | POA: Diagnosis not present

## 2023-11-17 DIAGNOSIS — F419 Anxiety disorder, unspecified: Secondary | ICD-10-CM

## 2023-11-17 DIAGNOSIS — E669 Obesity, unspecified: Secondary | ICD-10-CM

## 2023-11-17 DIAGNOSIS — E0590 Thyrotoxicosis, unspecified without thyrotoxic crisis or storm: Secondary | ICD-10-CM

## 2023-11-17 DIAGNOSIS — O99213 Obesity complicating pregnancy, third trimester: Secondary | ICD-10-CM

## 2023-11-17 DIAGNOSIS — Z3A28 28 weeks gestation of pregnancy: Secondary | ICD-10-CM

## 2023-11-17 DIAGNOSIS — O99283 Endocrine, nutritional and metabolic diseases complicating pregnancy, third trimester: Secondary | ICD-10-CM | POA: Diagnosis present

## 2023-11-17 DIAGNOSIS — O285 Abnormal chromosomal and genetic finding on antenatal screening of mother: Secondary | ICD-10-CM

## 2023-11-17 MED ORDER — FERROUS SULFATE 325 (65 FE) MG PO TABS: 325.0000 mg | ORAL_TABLET | Freq: Every day | ORAL | 1 refills | Status: DC

## 2023-11-17 MED ORDER — HYDROXYZINE HCL 25 MG PO TABS
25.0000 mg | ORAL_TABLET | Freq: Every day | ORAL | 1 refills | Status: DC
Start: 2023-11-17 — End: 2024-08-30

## 2023-11-17 NOTE — Progress Notes (Signed)
   PRENATAL VISIT NOTE  Subjective:  Alicia Turner is a 28 y.o. [id number] at [redacted]w[redacted]d being seen today for ongoing prenatal care.  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  Patient reports  questions about u/s and anemia,Reports bleeding has stopped .  Contractions: Not present. Vag. Bleeding: Scant.  Movement: Present. Denies leaking of fluid.   The following portions of the patient's history were reviewed and updated as appropriate: allergies, current medications, past family history, past medical history, past social history, past surgical history and problem list.   Objective:   Vitals:   11/17/23 1004  BP: 102/72  Pulse: 98  Weight: 212 lb 9.6 oz (96.4 kg)    Fetal Status: Fetal Heart Rate (bpm): 145   Movement: Present     General:  Alert, oriented and cooperative. Patient is in no acute distress.  Skin: Skin is warm and dry. No rash noted.   Cardiovascular: Normal heart rate noted  Respiratory: Normal respiratory effort, no problems with respiration noted  Abdomen: Soft, gravid, appropriate for gestational age.  Pain/Pressure: Absent     Pelvic: Cervical exam deferred        Extremities: Normal range of motion.  Edema: None  Mental Status: Normal mood and affect. Normal behavior. Normal judgment and thought content.   Assessment and Plan:  Pregnancy: [id number] at [redacted]w[redacted]d 1. Supervision of high risk pregnancy, antepartum (Primary) BP and FHR normal Doing well, feeling regular movement  DIscussed u/s subchorionic, precautions discussed when to follow up   2. Anemia affecting pregnancy in third trimester Very mild, checking CBC today, will start iron supplement every  other day, rx sent - ferrous sulfate  325 (65 FE) MG tablet; Take 1 tablet (325 mg total) by mouth daily with breakfast.  Dispense: 90 tablet; Refill: 1  3. Anxiety Was given rx in MAU says she takes PRN, sometimes at night and reports this is helping, will send refill today - hydrOXYzine  (ATARAX ) 25 MG tablet; Take 1 tablet (25 mg total) by mouth at bedtime.  Dispense: 30 tablet; Refill: 1  4. Hyperthyroidism  - Thyroid  Panel With TSH   Preterm labor symptoms and general obstetric precautions including but not limited to vaginal bleeding, contractions, leaking of fluid and fetal movement were reviewed in detail with the patient. Please refer to After Visit Summary for other counseling recommendations.   Return in about 2 weeks (around 12/01/2023) for OB VISIT (MD or APP).  Future Appointments  Date Time Provider Department Center  12/16/2023  9:15 AM [facility] NURSE [facility]  12/16/2023  9:30 AM [facility] US3 [facility]    Susi Eric, FNP

## 2023-11-17 NOTE — Progress Notes (Signed)
 Concerns of US  findings of bleeding.Also concerned of being Anemic.

## 2023-11-18 LAB — HIV ANTIBODY (ROUTINE TESTING W REFLEX): HIV Screen 4th Generation wRfx: NONREACTIVE

## 2023-11-18 LAB — GLUCOSE TOLERANCE, 2 HOURS W/ 1HR
Glucose, 1 hour: 138 mg/dL (ref 70–179)
Glucose, 2 hour: 127 mg/dL (ref 70–152)
Glucose, Fasting: 75 mg/dL (ref 70–91)

## 2023-11-18 LAB — CBC
Hematocrit: 34.7 % (ref 34.0–46.6)
Hemoglobin: 11.5 g/dL (ref 11.1–15.9)
MCH: 28.9 pg (ref 26.6–33.0)
MCHC: 33.1 g/dL (ref 31.5–35.7)
MCV: 87 fL (ref 79–97)
Platelets: 248 10*3/uL (ref 150–450)
RBC: 3.98 x10E6/uL (ref 3.77–5.28)
RDW: 12.3 % (ref 11.7–15.4)
WBC: 7.5 10*3/uL (ref 3.4–10.8)

## 2023-11-18 LAB — THYROID PANEL WITH TSH
Free Thyroxine Index: 1.1 — ABNORMAL LOW (ref 1.2–4.9)
T3 Uptake Ratio: 13 % — ABNORMAL LOW (ref 24–39)
T4, Total: 8.7 ug/dL (ref 4.5–12.0)
TSH: 0.603 u[IU]/mL (ref 0.450–4.500)

## 2023-11-18 LAB — RPR: RPR Ser Ql: NONREACTIVE

## 2023-11-20 ENCOUNTER — Encounter: Payer: Self-pay | Admitting: *Deleted

## 2023-11-21 ENCOUNTER — Other Ambulatory Visit: Payer: Medicaid Other

## 2023-11-21 ENCOUNTER — Encounter: Payer: Medicaid Other | Admitting: Obstetrics and Gynecology

## 2023-12-02 ENCOUNTER — Other Ambulatory Visit: Payer: Self-pay

## 2023-12-02 ENCOUNTER — Ambulatory Visit: Payer: Medicaid Other | Admitting: Certified Nurse Midwife

## 2023-12-02 VITALS — BP 115/75 | HR 91 | Wt 217.9 lb

## 2023-12-02 DIAGNOSIS — Z3A30 30 weeks gestation of pregnancy: Secondary | ICD-10-CM

## 2023-12-02 DIAGNOSIS — O0993 Supervision of high risk pregnancy, unspecified, third trimester: Secondary | ICD-10-CM | POA: Diagnosis not present

## 2023-12-02 DIAGNOSIS — F419 Anxiety disorder, unspecified: Secondary | ICD-10-CM | POA: Diagnosis not present

## 2023-12-02 DIAGNOSIS — O0990 Supervision of high risk pregnancy, unspecified, unspecified trimester: Secondary | ICD-10-CM

## 2023-12-02 DIAGNOSIS — E0590 Thyrotoxicosis, unspecified without thyrotoxic crisis or storm: Secondary | ICD-10-CM | POA: Diagnosis not present

## 2023-12-02 NOTE — Patient Instructions (Signed)
   PREGNANCY SUPPORT BELT: You are not alone, Seventy-five percent of women have some sort of abdominal or back pain at some point in their pregnancy. Your baby is growing at a fast pace, which means that your whole body is rapidly trying to adjust to the changes. As your uterus grows, your back may start feeling a bit under stress and this can result in back or abdominal pain that can go from mild, and therefore bearable, to severe pains that will not allow you to sit or lay down comfortably, When it comes to dealing with pregnancy-related pains and cramps, some pregnant women usually prefer natural remedies, which the market is filled with nowadays. For example, wearing a pregnancy support belt can help ease and lessen your discomfort and pain.  WHAT ARE THE BENEFITS OF WEARING A PREGNANCY SUPPORT BELT? A pregnancy support belt provides support to the lower portion of the belly taking some of the weight of the growing uterus and distributing to the other parts of your body. It is designed make you comfortable and gives you extra support. Over the years, the pregnancy apparel market has been studying the needs and wants of pregnant women and they have come up with the most comfortable pregnancy support belts that woman could ever ask for. In fact, you will no longer have to wear a stretched-out or bulky pregnancy belt that is visible underneath your clothes and makes you feel even more uncomfortable. Nowadays, a pregnancy support belt is made of comfortable and stretchy materials that will not irritate your skin but will actually make you feel at ease and you will not even notice you are wearing it. They are easy to put on and adjust during the day and can be worn at night for additional support.  BENEFITS: Relives Back pain Relieves Abdominal Muscle and Leg Pain Stabilizes the Pelvic Ring Offers a Cushioned Abdominal Lift Pad Relieves pressure on the Sciatic Nerve Within Minutes WHERE TO GET YOUR  Pregnancy Belt/Maternity Belt/Belly Band:    [employer] [address] [place]  [place], [place] [address] [phone]  [facility] [address] [place], [place] [address] [phone]  [employer] [address]  [place], [place] [address] [phone]  [employer] [address]  [place], [place] [address] [phone]

## 2023-12-02 NOTE — Progress Notes (Signed)
   PRENATAL VISIT NOTE  Subjective:  Alicia Turner is a 28 y.o. [id number] at [redacted]w[redacted]d being seen today for ongoing prenatal care.  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  Patient reports backache and AM loose stools .  Contractions: Irritability. Vag. Bleeding: None.  Movement: Present. Denies leaking of fluid.   The following portions of the patient's history were reviewed and updated as appropriate: allergies, current medications, past family history, past medical history, past social history, past surgical history and problem list.   Objective:   Vitals:   12/02/23 1120  BP: 115/75  Pulse: 91  Weight: 217 lb 14.4 oz (98.8 kg)    Fetal Status: Fetal Heart Rate (bpm): 153 Fundal Height: 31 cm Movement: Present     General:  Alert, oriented and cooperative. Patient is in no acute distress.  Skin: Skin is warm and dry. No rash noted.   Cardiovascular: Normal heart rate noted  Respiratory: Normal respiratory effort, no problems with respiration noted  Abdomen: Soft, gravid, appropriate for gestational age.  Pain/Pressure: Present     Pelvic: Cervical exam deferred        Extremities: Normal range of motion.  Edema: Trace (hand)  Mental Status: Normal mood and affect. Normal behavior. Normal judgment and thought content.   Assessment and Plan:  Pregnancy: [id number] at [redacted]w[redacted]d 1. Supervision of high risk pregnancy, antepartum (Primary) - Doing well, feeling regular and vigorous fetal movement - Advise stretches and position changes as well as pregnancy support belt for comfort. - Loose stool - advise BRAT diet and consider psyllium husk to bulk stool.   2. [redacted] weeks gestation  of pregnancy - Routine PN care.   3. Anxiety - Coping well.   4. Hyperthyroidism - Well controlled, WNL with 3T labs.   Preterm labor symptoms and general obstetric precautions including but not limited to vaginal bleeding, contractions, leaking of fluid and fetal movement were reviewed in detail with the patient. Please refer to After Visit Summary for other counseling recommendations.   Return in about 2 weeks (around 12/16/2023) for LOB, same day as ultrasound if possible.  Future Appointments  Date Time Provider Department Center  12/16/2023  9:15 AM [facility] NURSE [facility]  12/16/2023  9:30 AM [facility] US3 [facility]  12/16/2023 10:55 AM Warren-Hill, Clarita Crane, CNM [facility]    Richardson Landry, CNM

## 2023-12-15 NOTE — Progress Notes (Unsigned)
   PRENATAL VISIT NOTE  Subjective:  Alicia Turner is a 28 y.o. [id number] at [redacted]w[redacted]d being seen today for ongoing prenatal care.  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  Patient reports {sx:14538}.   .  .   . Denies leaking of fluid.   The following portions of the patient's history were reviewed and updated as appropriate: allergies, current medications, past family history, past medical history, past social history, past surgical history and problem list.   Objective:  There were no vitals filed for this visit.  Fetal Status:           General:  Alert, oriented and cooperative. Patient is in no acute distress.  Skin: Skin is warm and dry. No rash noted.   Cardiovascular: Normal heart rate noted  Respiratory: Normal respiratory effort, no problems with respiration noted  Abdomen: Soft, gravid, appropriate for gestational age.        Pelvic: {Blank single:19197::"Cervical exam performed in the presence of a chaperone","Cervical exam deferred"}        Extremities: Normal range of motion.     Mental Status: Normal mood and affect. Normal behavior. Normal judgment and thought content.   Assessment and Plan:  Pregnancy: [id number] at [redacted]w[redacted]d 1. Supervision of high risk pregnancy, antepartum (Primary) ***  2. [redacted] weeks gestation of pregnancy ***  3. Anxiety ***  4. Hyperthyroidism ***  5. Unwanted fertility ***  {Blank single:19197::"Term","Preterm"} labor symptoms and general obstetric precautions including but not limited to vaginal bleeding, contractions, leaking of fluid and fetal movement were reviewed in detail with the  patient. Please refer to After Visit Summary for other counseling recommendations.   Return in about 2 weeks (around 12/30/2023) for LOB, with CNM please.  Future Appointments  Date Time Provider Department Center  12/16/2023  9:15 AM [facility] NURSE [facility]  12/16/2023  9:30 AM [facility] US3 [facility]  12/16/2023 10:55 AM Warren-Hill, Clarita Crane, CNM [facility]    Richardson Landry, CNM

## 2023-12-16 ENCOUNTER — Ambulatory Visit

## 2023-12-16 ENCOUNTER — Ambulatory Visit: Payer: Medicaid Other | Admitting: Certified Nurse Midwife

## 2023-12-16 ENCOUNTER — Other Ambulatory Visit: Payer: Self-pay

## 2023-12-16 ENCOUNTER — Ambulatory Visit: Payer: Medicaid Other | Attending: Obstetrics and Gynecology

## 2023-12-16 ENCOUNTER — Ambulatory Visit: Payer: Medicaid Other

## 2023-12-16 VITALS — BP 108/74 | HR 102 | Wt 221.0 lb

## 2023-12-16 VITALS — BP 110/62 | HR 101

## 2023-12-16 DIAGNOSIS — E669 Obesity, unspecified: Secondary | ICD-10-CM

## 2023-12-16 DIAGNOSIS — O99213 Obesity complicating pregnancy, third trimester: Secondary | ICD-10-CM | POA: Insufficient documentation

## 2023-12-16 DIAGNOSIS — Z3A33 33 weeks gestation of pregnancy: Secondary | ICD-10-CM | POA: Diagnosis not present

## 2023-12-16 DIAGNOSIS — O0993 Supervision of high risk pregnancy, unspecified, third trimester: Secondary | ICD-10-CM | POA: Diagnosis not present

## 2023-12-16 DIAGNOSIS — E0590 Thyrotoxicosis, unspecified without thyrotoxic crisis or storm: Secondary | ICD-10-CM

## 2023-12-16 DIAGNOSIS — Z3009 Encounter for other general counseling and advice on contraception: Secondary | ICD-10-CM

## 2023-12-16 DIAGNOSIS — O0990 Supervision of high risk pregnancy, unspecified, unspecified trimester: Secondary | ICD-10-CM | POA: Diagnosis present

## 2023-12-16 DIAGNOSIS — Z148 Genetic carrier of other disease: Secondary | ICD-10-CM

## 2023-12-16 DIAGNOSIS — Z3A32 32 weeks gestation of pregnancy: Secondary | ICD-10-CM

## 2023-12-16 DIAGNOSIS — K219 Gastro-esophageal reflux disease without esophagitis: Secondary | ICD-10-CM

## 2023-12-16 DIAGNOSIS — O99282 Endocrine, nutritional and metabolic diseases complicating pregnancy, second trimester: Secondary | ICD-10-CM

## 2023-12-16 DIAGNOSIS — F419 Anxiety disorder, unspecified: Secondary | ICD-10-CM | POA: Diagnosis not present

## 2023-12-16 MED ORDER — PANTOPRAZOLE SODIUM 40 MG PO TBEC: 40.0000 mg | DELAYED_RELEASE_TABLET | Freq: Every day | ORAL | 2 refills | Status: DC

## 2023-12-16 NOTE — Patient Instructions (Signed)
 You may take magnesium at bedtime daily to help promote rest, relieve swelling and help ease bowel movements. I recommend either magnesium oxide or magnesium glycinate.  You can also consider Unisom (or generic) over the counter to promote rest (avoid the gel caps).

## 2023-12-17 ENCOUNTER — Encounter (HOSPITAL_COMMUNITY): Payer: Self-pay | Admitting: Obstetrics & Gynecology

## 2023-12-17 ENCOUNTER — Inpatient Hospital Stay (HOSPITAL_COMMUNITY)
Admission: AD | Admit: 2023-12-17 | Discharge: 2023-12-17 | Disposition: A | Discharge status: Home / Self Care | Attending: Obstetrics & Gynecology | Admitting: Obstetrics & Gynecology

## 2023-12-17 ENCOUNTER — Other Ambulatory Visit: Payer: Self-pay | Admitting: *Deleted

## 2023-12-17 DIAGNOSIS — O99283 Endocrine, nutritional and metabolic diseases complicating pregnancy, third trimester: Secondary | ICD-10-CM

## 2023-12-17 DIAGNOSIS — O99891 Other specified diseases and conditions complicating pregnancy: Secondary | ICD-10-CM | POA: Diagnosis not present

## 2023-12-17 DIAGNOSIS — M545 Low back pain, unspecified: Secondary | ICD-10-CM | POA: Insufficient documentation

## 2023-12-17 DIAGNOSIS — O26893 Other specified pregnancy related conditions, third trimester: Secondary | ICD-10-CM | POA: Insufficient documentation

## 2023-12-17 DIAGNOSIS — M25559 Pain in unspecified hip: Secondary | ICD-10-CM | POA: Diagnosis present

## 2023-12-17 DIAGNOSIS — M25552 Pain in left hip: Secondary | ICD-10-CM

## 2023-12-17 DIAGNOSIS — Z3A32 32 weeks gestation of pregnancy: Secondary | ICD-10-CM | POA: Insufficient documentation

## 2023-12-17 DIAGNOSIS — M549 Dorsalgia, unspecified: Secondary | ICD-10-CM

## 2023-12-17 DIAGNOSIS — O09893 Supervision of other high risk pregnancies, third trimester: Secondary | ICD-10-CM

## 2023-12-17 DIAGNOSIS — M25551 Pain in right hip: Secondary | ICD-10-CM

## 2023-12-17 DIAGNOSIS — O4703 False labor before 37 completed weeks of gestation, third trimester: Secondary | ICD-10-CM | POA: Diagnosis not present

## 2023-12-17 LAB — URINALYSIS, ROUTINE W REFLEX MICROSCOPIC
Bilirubin Urine: NEGATIVE
Glucose, UA: NEGATIVE mg/dL
Hgb urine dipstick: NEGATIVE
Ketones, ur: 20 mg/dL — AB
Nitrite: NEGATIVE
Protein, ur: 30 mg/dL — AB
Specific Gravity, Urine: 1.019 (ref 1.005–1.030)
pH: 5 (ref 5.0–8.0)

## 2023-12-17 LAB — FETAL FIBRONECTIN: Fetal Fibronectin: NEGATIVE

## 2023-12-17 MED ORDER — OXYCODONE-ACETAMINOPHEN 5-325 MG PO TABS
1.0000 | ORAL_TABLET | Freq: Once | ORAL | Status: AC
  Administered 2023-12-17: 1 via ORAL
  Filled 2023-12-17: qty 1

## 2023-12-17 MED ORDER — NIFEDIPINE 10 MG PO CAPS
10.0000 mg | ORAL_CAPSULE | ORAL | Status: DC | PRN
  Administered 2023-12-17 (×2): 10 mg via ORAL
  Filled 2023-12-17 (×2): qty 1

## 2023-12-17 MED ORDER — CYCLOBENZAPRINE HCL 5 MG PO TABS
5.0000 mg | ORAL_TABLET | Freq: Once | ORAL | Status: AC
  Administered 2023-12-17: 5 mg via ORAL
  Filled 2023-12-17: qty 1

## 2023-12-17 NOTE — MAU Note (Signed)
.  Alicia Turner is a 28 y.o. at [redacted]w[redacted]d here in MAU reporting:  Onset of complaint: since 0100 pt reports hip and lower back pain that wont go away. Pt thinks she is having ctxs. Pt states she took tylenol at 0300 and it did not help. Pt also used a heating pad.   No lOF no bleeding. +FM.    Vitals:   12/17/23 0318  BP: (!) 106/51  Pulse: (!) 126  Resp: 19  Temp: 99.9 F (37.7 C)  SpO2: 99%     Pain score: 10/10 hip pain and lower back pain pressure.   FHT: 159 Lab orders placed from triage: Urine

## 2023-12-17 NOTE — MAU Provider Note (Signed)
 Chief Complaint:  Hip Pain and Back Pain   Event Date/Time   First Provider Initiated Contact with Patient 12/17/23 0330     HPI: Alicia Turner is a 28 y.o. [id number] at 15w5dwho presents to maternity admissions reporting severe back pain and hip pain. Not sure if she is having contractions.  Tylenol did not help. She reports good fetal movement, denies LOF, vaginal bleeding,  urinary symptoms, n/v, diarrhea, constipation or fever/chills.    Hip Pain  The incident occurred less than 1 hour ago. The incident occurred at home. There was no injury mechanism. The pain has been Fluctuating since onset. Pertinent negatives include no numbness.  Back Pain This is a new problem. The current episode started today. The pain is present in the lumbar spine. The quality of the pain is described as cramping. Associated symptoms include abdominal pain and pelvic pain. Pertinent negatives include no dysuria, fever or numbness.   RN Note: Alicia Turner is a 28 y.o. at [redacted]w[redacted]d here in MAU reporting:  Onset of complaint: since 0100 pt reports hip and lower back pain that wont go away. Pt thinks she is having ctxs. Pt states she took tylenol at 0300 and it did not help. Pt also used a heating pad.   No lOF no bleeding. +FM.   Past Medical History: Past Medical History:  Diagnosis Date   Anemia    Eczema    Erythema nodosum    Hyperglycemia    Hyperthyroidism    Left breast lump    Non-reassuring fetal heart rate or rhythm affecting mother 11/17/2020    Past obstetric history: OB History  Gravida Para Term Preterm AB Living  6 2 2  0 3 2  SAB IAB Ectopic Multiple Live Births  3    2    # Outcome Date GA Lbr Len/2nd Weight Sex Type Anes PTL Lv  6 Current           5 Term 11/18/20 [redacted]w[redacted]d  3147 g M Vag-Spont   LIV  4 SAB 10/03/19 [redacted]w[redacted]d         3 Term 12/20/17 [redacted]w[redacted]d  3062 g M Vag-Spont EPI N LIV  2 SAB           1 SAB             Past Surgical History: Past Surgical History:  Procedure Laterality Date    BREAST BIOPSY Right 06/16/2020   BREAST BIOPSY Left 2019   NO PAST SURGERIES      Family History: Family History  Problem Relation Age of Onset   Arthritis Mother        rheumatoid   Hypertension Mother    Hyperlipidemia Mother    Diabetes Mellitus II Mother    Healthy Father    Thyroid cancer Maternal Aunt     Social History: Social History   Tobacco Use   Smoking status: Never   Smokeless tobacco: Never  Vaping Use   Vaping status: Never Used  Substance Use Topics   Alcohol use: Not Currently    Comment: occasionally   Drug use: Never    Allergies: No Known Allergies  Meds:  Medications Prior to Admission  Medication Sig Dispense Refill Last Dose/Taking   ferrous sulfate 325 (65 FE) MG tablet Take 1 tablet (325 mg total) by mouth daily with breakfast. 90 tablet 1 12/16/2023   Prenatal Vit-Fe Fumarate-FA (PRENATAL VITAMIN PO) Take 1 tablet by mouth daily.   12/16/2023   sertraline (ZOLOFT) 50 MG  tablet Take 100 mg by mouth daily.   12/16/2023   VITAMIN D PO Take by mouth.   12/16/2023   acetaminophen (TYLENOL) 325 MG tablet Take 2 tablets (650 mg total) by mouth every 4 (four) hours as needed (for pain scale < 4). (Patient not taking: Reported on 12/16/2023) 30 tablet 0    albuterol (VENTOLIN HFA) 108 (90 Base) MCG/ACT inhaler Inhale 1-2 puffs into the lungs every 6 (six) hours as needed for wheezing or shortness of breath. (Patient not taking: Reported on 12/16/2023) 8 g 1    hydrOXYzine (ATARAX) 25 MG tablet Take 1 tablet (25 mg total) by mouth at bedtime. 30 tablet 1    pantoprazole (PROTONIX) 40 MG tablet Take 1 tablet (40 mg total) by mouth daily. 30 tablet 2    polyethylene glycol powder (GLYCOLAX/MIRALAX) 17 GM/SCOOP powder Take 17 g by mouth daily as needed. (Patient not taking: Reported on 12/16/2023) 510 g 1     I have reviewed patient's Past Medical Hx, Surgical Hx, Family Hx, Social Hx, medications and allergies.   ROS:  Review of Systems  Constitutional:   Negative for fever.  Gastrointestinal:  Positive for abdominal pain.  Genitourinary:  Positive for pelvic pain. Negative for dysuria.  Musculoskeletal:  Positive for back pain.  Neurological:  Negative for numbness.   Other systems negative  Physical Exam  Patient Vitals for the past 24 hrs:  BP Temp Temp src Pulse Resp SpO2 Height Weight  12/17/23 0318 (!) 106/51 99.9 F (37.7 C) Oral (!) 126 19 99 % 5\' 5"  (1.651 m) 98.7 kg   Constitutional: Well-developed, well-nourished female in no acute distress.  Cardiovascular: normal rate  Respiratory: normal effort GI: Abd soft, non-tender, gravid appropriate for gestational age.   No rebound or guarding. MS: Extremities nontender, no edema, normal ROM Neurologic: Alert and oriented x 4.  Tender over lumbar spine.   PELVIC EXAM:  Dilation: Closed Station: Ballotable Exam by:: Wynelle Bourgeois, CNM Cervix long  FHT:  Baseline 145 , moderate variability, accelerations present, no decelerations Contractions:  Irregular    Labs: Results for orders placed or performed during the hospital encounter of 12/17/23 (from the past 24 hours)  Urinalysis, Routine w reflex microscopic -Urine, Clean Catch     Status: Abnormal   Collection Time: 12/17/23  3:29 AM  Result Value Ref Range   Color, Urine AMBER (A) YELLOW   APPearance CLOUDY (A) CLEAR   Specific Gravity, Urine 1.019 1.005 - 1.030   pH 5.0 5.0 - 8.0   Glucose, UA NEGATIVE NEGATIVE mg/dL   Hgb urine dipstick NEGATIVE NEGATIVE   Bilirubin Urine NEGATIVE NEGATIVE   Ketones, ur 20 (A) NEGATIVE mg/dL   Protein, ur 30 (A) NEGATIVE mg/dL   Nitrite NEGATIVE NEGATIVE   Leukocytes,Ua MODERATE (A) NEGATIVE   RBC / HPF 0-5 0 - 5 RBC/hpf   WBC, UA 21-50 0 - 5 WBC/hpf   Bacteria, UA MANY (A) NONE SEEN   Squamous Epithelial / HPF 11-20 0 - 5 /HPF   Mucus PRESENT   Fetal fibronectin     Status: None   Collection Time: 12/17/23  3:44 AM  Result Value Ref Range   Fetal Fibronectin NEGATIVE  NEGATIVE    --/--/O POS (02/05 2036)  Imaging:    MAU Course/MDM: I have reviewed the triage vital signs and the nursing notes.   Pertinent labs & imaging results that were available during my care of the patient were reviewed by me and considered in  my medical decision making (see chart for details).      I have reviewed her medical records including past results, notes and treatments.   I have ordered labs and reviewed results.  NST reviewed  Treatments in MAU included Flexeril which did not help back pain. After monitor adjusted, UCs were noted.  Procardia given x 2 doses which stopped contractions and pain.  One percocet given for additional pain relief.  Discussed pain was related to Threatened PTL .    Assessment: Single IUP at [redacted]w[redacted]d Low back and hip pain Threatened preterm labor  Plan: Discharge home Preterm Labor precautions and fetal kick counts Follow up in Office for prenatal visits and recheck Encouraged to return if she develops worsening of symptoms, increase in pain, fever, or other concerning symptoms.   Pt stable at time of discharge.  Wynelle Bourgeois CNM, MSN Certified Nurse-Midwife 12/17/2023 3:30 AM

## 2023-12-18 LAB — CULTURE, OB URINE

## 2023-12-20 ENCOUNTER — Inpatient Hospital Stay (HOSPITAL_COMMUNITY)
Admission: AD | Admit: 2023-12-20 | Discharge: 2023-12-20 | Disposition: A | Discharge status: Home / Self Care | Payer: Self-pay | Attending: Obstetrics and Gynecology | Admitting: Obstetrics and Gynecology

## 2023-12-20 ENCOUNTER — Inpatient Hospital Stay (HOSPITAL_COMMUNITY)

## 2023-12-20 ENCOUNTER — Encounter (HOSPITAL_COMMUNITY): Payer: Self-pay | Admitting: Obstetrics and Gynecology

## 2023-12-20 ENCOUNTER — Other Ambulatory Visit: Payer: Self-pay

## 2023-12-20 DIAGNOSIS — O288 Other abnormal findings on antenatal screening of mother: Secondary | ICD-10-CM | POA: Diagnosis not present

## 2023-12-20 DIAGNOSIS — E669 Obesity, unspecified: Secondary | ICD-10-CM | POA: Diagnosis not present

## 2023-12-20 DIAGNOSIS — E860 Dehydration: Secondary | ICD-10-CM | POA: Insufficient documentation

## 2023-12-20 DIAGNOSIS — O99283 Endocrine, nutritional and metabolic diseases complicating pregnancy, third trimester: Secondary | ICD-10-CM | POA: Diagnosis not present

## 2023-12-20 DIAGNOSIS — O479 False labor, unspecified: Secondary | ICD-10-CM

## 2023-12-20 DIAGNOSIS — Z79899 Other long term (current) drug therapy: Secondary | ICD-10-CM | POA: Diagnosis not present

## 2023-12-20 DIAGNOSIS — O368130 Decreased fetal movements, third trimester, not applicable or unspecified: Secondary | ICD-10-CM | POA: Diagnosis not present

## 2023-12-20 DIAGNOSIS — O4703 False labor before 37 completed weeks of gestation, third trimester: Secondary | ICD-10-CM | POA: Insufficient documentation

## 2023-12-20 DIAGNOSIS — Z3A33 33 weeks gestation of pregnancy: Secondary | ICD-10-CM | POA: Diagnosis not present

## 2023-12-20 DIAGNOSIS — O99213 Obesity complicating pregnancy, third trimester: Secondary | ICD-10-CM | POA: Diagnosis not present

## 2023-12-20 DIAGNOSIS — O4700 False labor before 37 completed weeks of gestation, unspecified trimester: Secondary | ICD-10-CM

## 2023-12-20 LAB — URINALYSIS, ROUTINE W REFLEX MICROSCOPIC
Glucose, UA: NEGATIVE mg/dL
Hgb urine dipstick: NEGATIVE
Ketones, ur: NEGATIVE mg/dL
Nitrite: NEGATIVE
Protein, ur: 30 mg/dL — AB
Specific Gravity, Urine: 1.026 (ref 1.005–1.030)
pH: 5 (ref 5.0–8.0)

## 2023-12-20 LAB — WET PREP, GENITAL
Clue Cells Wet Prep HPF POC: NONE SEEN
Sperm: NONE SEEN
Trich, Wet Prep: NONE SEEN
WBC, Wet Prep HPF POC: <10 (ref <10)
Yeast Wet Prep HPF POC: NONE SEEN

## 2023-12-20 MED ORDER — ACETAMINOPHEN 500 MG PO TABS
1000.0000 mg | ORAL_TABLET | Freq: Once | ORAL | Status: AC
  Administered 2023-12-20: 1000 mg via ORAL
  Filled 2023-12-20: qty 2

## 2023-12-20 MED ORDER — CYCLOBENZAPRINE HCL 5 MG PO TABS
10.0000 mg | ORAL_TABLET | Freq: Once | ORAL | Status: AC
  Administered 2023-12-20: 10 mg via ORAL
  Filled 2023-12-20: qty 2

## 2023-12-20 MED ORDER — LACTATED RINGERS IV BOLUS
1000.0000 mL | Freq: Once | INTRAVENOUS | Status: AC
  Administered 2023-12-20: 1000 mL via INTRAVENOUS

## 2023-12-20 NOTE — MAU Provider Note (Signed)
 History     CSN: [id number]  Arrival date and time: 12/20/23 1423   Event Date/Time   First Provider Initiated Contact with Patient 12/20/23 1506      Chief Complaint  Patient presents with   Contractions   Alicia Turner , a  28 y.o. [id number] at [redacted]w[redacted]d presents to MAU with complaints of contractions that started today. Patient reports  lower abdominal tightening and pain that radiates towards her back.  Patient reports contractions as an 8 out of 10, denies attempting to alleviate symptoms.  Patient states that contractions are irregular and inconsistent.  Fluid intake for today has only been 1-2 bottles of water however patient reports 90 ounces of water daily.  Patient also complains of decreased fetal movement.  Patient reports that she has only felt baby move 4 times today.  She denies abnormal vaginal discharge aside from the mucous plug.  She also denies vaginal bleeding passing clots.   Patient reports she lost a piece of her mucous plug about 130 this morning and noted a thinner vaginal discharge since.  Patient denies having to wear a pad or consistently getting since loss of mucous plug early this morning.  Patient previously seen in MAU for similar symptoms.         OB History     Gravida  6   Para  2   Term  2   Preterm  0   AB  3   Living  2      SAB  3   IAB      Ectopic      Multiple      Live Births  2           Past Medical History:  Diagnosis Date   Anemia    Eczema    Erythema nodosum    Hyperglycemia    Hyperthyroidism    Left breast lump    Non-reassuring fetal heart rate or rhythm affecting mother 11/17/2020    Past Surgical History:  Procedure Laterality Date   BREAST BIOPSY Right 06/16/2020   BREAST BIOPSY Left 2019   NO PAST SURGERIES      Family History  Problem Relation Age of Onset   Arthritis Mother        rheumatoid   Hypertension Mother    Hyperlipidemia Mother    Diabetes Mellitus II Mother    Healthy  Father    Thyroid cancer Maternal Aunt     Social History   Tobacco Use   Smoking status: Never   Smokeless tobacco: Never  Vaping Use   Vaping status: Never Used  Substance Use Topics   Alcohol use: Not Currently    Comment: occasionally   Drug use: Never    Allergies: No Known Allergies  Medications Prior to Admission  Medication Sig Dispense Refill Last Dose/Taking   ferrous sulfate 325 (65 FE) MG tablet Take 1 tablet (325 mg total) by mouth daily with breakfast. 90 tablet 1 12/20/2023   Prenatal Vit-Fe Fumarate-FA (PRENATAL VITAMIN PO) Take 1 tablet by mouth daily.   12/20/2023   sertraline (ZOLOFT) 50 MG tablet Take 100 mg by mouth daily.   12/20/2023   VITAMIN D PO Take by mouth.   12/20/2023   acetaminophen (TYLENOL) 325 MG tablet Take 2 tablets (650 mg total) by mouth every 4 (four) hours as needed (for pain scale < 4). (Patient not taking: Reported on 12/16/2023) 30 tablet 0    albuterol (VENTOLIN HFA) 108 (90  Base) MCG/ACT inhaler Inhale 1-2 puffs into the lungs every 6 (six) hours as needed for wheezing or shortness of breath. (Patient not taking: Reported on 12/16/2023) 8 g 1    hydrOXYzine (ATARAX) 25 MG tablet Take 1 tablet (25 mg total) by mouth at bedtime. 30 tablet 1    pantoprazole (PROTONIX) 40 MG tablet Take 1 tablet (40 mg total) by mouth daily. 30 tablet 2    polyethylene glycol powder (GLYCOLAX/MIRALAX) 17 GM/SCOOP powder Take 17 g by mouth daily as needed. (Patient not taking: Reported on 12/16/2023) 510 g 1     Review of Systems  Constitutional:  Negative for chills, fatigue and fever.  Eyes:  Negative for pain and visual disturbance.  Respiratory:  Negative for apnea, shortness of breath and wheezing.   Cardiovascular:  Negative for chest pain and palpitations.  Gastrointestinal:  Positive for abdominal pain. Negative for constipation, diarrhea, nausea and vomiting.  Genitourinary:  Positive for pelvic pain. Negative for difficulty urinating, dysuria, vaginal  bleeding, vaginal discharge and vaginal pain.  Musculoskeletal:  Positive for back pain.  Neurological:  Negative for seizures, weakness and headaches.  Psychiatric/Behavioral:  Negative for suicidal ideas.    Physical Exam   Blood pressure 106/81, pulse 96, temperature 98.2 F (36.8 C), temperature source Oral, resp. rate 12, last menstrual period 04/21/2023, SpO2 96%, unknown if currently breastfeeding.  Physical Exam Vitals and nursing note reviewed. Exam conducted with a chaperone present.  Constitutional:      General: She is not in acute distress.    Appearance: Normal appearance.  HENT:     Head: Normocephalic.  Cardiovascular:     Rate and Rhythm: Normal rate.  Pulmonary:     Effort: Pulmonary effort is normal.  Abdominal:     Palpations: Abdomen is soft.     Tenderness: There is abdominal tenderness.     Comments: Gravid uterus  Genitourinary:    Comments: 1/50/ballottable S. Suzie Portela CNM.  Musculoskeletal:     Cervical back: Normal range of motion.  Skin:    General: Skin is warm and dry.  Neurological:     Mental Status: She is alert and oriented to person, place, and time.  Psychiatric:        Mood and Affect: Mood normal.    FHT: 135 bpm with moderate varaibilty. Accels present only 10x10s  Tooc: regular   MAU Course  Procedures Orders Placed This Encounter  Procedures   Wet prep, genital   Urinalysis, Routine w reflex microscopic -Urine, Clean Catch   Insert peripheral IV   Dilation: 1 Effacement (%): 50 Cervical Position: Posterior Exam by:: S.Aliviya Schoeller,CNM  MDM -UA noted mild dehydration.  IV fluid bolus ordered. -P.o. Tylenol and Flexeril given for contractions and discomfort. - Contractions spaced out with IV hydration.  - Patient denies feeling them.  - NST only with 10x10s, and with the DFM patient sent for bpp.  - BPP 8/8 and NST reactive and reassuring after [place].  - Cervix unchanged.  - Plan for discharge.  Assessment and Plan   1.  Non-reactive NST (non-stress test)   2. [redacted] weeks gestation of pregnancy   3. Preterm contractions   4. False labor   5. Dehydration    - Reviewed signs and symptoms of labor.  - Recommended to increase water intake as dehydration can cause Preterm contractions.  - Reviewed worsening signs and return precautions.  - FHT appropriate for gestational age at time of discharge.  - Patient discharged home in stable  condition and may return to MAU as needed.    Claudette Head, MSN CNM  12/20/2023, 3:06 PM

## 2023-12-20 NOTE — MAU Note (Signed)
.  Alicia Turner is a 28 y.o. at [redacted]w[redacted]d here in MAU reporting: Patient reports ctx's that started at 1000. Patient reports decreased fetal movement today. Patient reports "endless peeing" this morning unsure if it was her water or not.  Onset of complaint: today 1000 Pain score: 8/10 There were no vitals filed for this visit.   FHT:157 Lab orders placed from triage:   ua

## 2023-12-30 ENCOUNTER — Telehealth: Payer: Self-pay | Admitting: Family Medicine

## 2023-12-30 DIAGNOSIS — L299 Pruritus, unspecified: Secondary | ICD-10-CM

## 2023-12-30 NOTE — Telephone Encounter (Signed)
 Patient is on the phone stating she has a bunch of complications, she is losing her mucus plug, she is extremely itchy, darkk color urine. Dilated 1.5 cm. She was in MAU already She is requesting lab work. She states she is not dehydrated.

## 2023-12-31 ENCOUNTER — Other Ambulatory Visit

## 2023-12-31 ENCOUNTER — Other Ambulatory Visit: Payer: Self-pay

## 2023-12-31 DIAGNOSIS — E0590 Thyrotoxicosis, unspecified without thyrotoxic crisis or storm: Secondary | ICD-10-CM

## 2023-12-31 DIAGNOSIS — L299 Pruritus, unspecified: Secondary | ICD-10-CM

## 2023-12-31 NOTE — Telephone Encounter (Signed)
 Pt returned call with [facility] nurse on the line.  Pt reports that she is having itching in palms and feet.  I advised pt that due to the office not having any sooner appts we can have her come in for labs to test for cholestasis.  Pt agreed to come in on 12/31/23 at 3:30pm for a bile acids and CMP per Edd Arbour, CNM.  Leonette Nutting

## 2023-12-31 NOTE — Telephone Encounter (Signed)
 Left message to return call back.   Alicia Turner

## 2023-12-31 NOTE — Addendum Note (Signed)
 Addended by: Faythe Casa on: 12/31/2023 02:22 PM   Modules accepted: Orders

## 2024-01-01 LAB — COMPREHENSIVE METABOLIC PANEL WITH GFR
ALT: 12 IU/L (ref 0–32)
AST: 21 IU/L (ref 0–40)
Albumin: 3.3 g/dL — ABNORMAL LOW (ref 4.0–5.0)
Alkaline Phosphatase: 109 IU/L (ref 44–121)
BUN/Creatinine Ratio: 8 — ABNORMAL LOW (ref 9–23)
BUN: 4 mg/dL — ABNORMAL LOW (ref 6–20)
Bilirubin Total: 0.4 mg/dL (ref 0.0–1.2)
CO2: 22 mmol/L (ref 20–29)
Calcium: 8.8 mg/dL (ref 8.7–10.2)
Chloride: 104 mmol/L (ref 96–106)
Creatinine, Ser: 0.48 mg/dL — ABNORMAL LOW (ref 0.57–1.00)
Globulin, Total: 2.4 g/dL (ref 1.5–4.5)
Glucose: 103 mg/dL — ABNORMAL HIGH (ref 70–99)
Potassium: 3.6 mmol/L (ref 3.5–5.2)
Sodium: 139 mmol/L (ref 134–144)
Total Protein: 5.7 g/dL — ABNORMAL LOW (ref 6.0–8.5)
eGFR: 131 mL/min/{1.73_m2} (ref >59)

## 2024-01-01 LAB — THYROID PANEL WITH TSH
Free Thyroxine Index: 1.1 — ABNORMAL LOW (ref 1.2–4.9)
T3 Uptake Ratio: 13 % — ABNORMAL LOW (ref 24–39)
T4, Total: 8.4 ug/dL (ref 4.5–12.0)
TSH: 0.671 u[IU]/mL (ref 0.450–4.500)

## 2024-01-01 LAB — BILE ACIDS, TOTAL: Bile Acids Total: 5.7 umol/L (ref 0.0–10.0)

## 2024-01-03 ENCOUNTER — Encounter: Payer: Self-pay | Admitting: Certified Nurse Midwife

## 2024-01-05 ENCOUNTER — Encounter: Payer: Self-pay | Admitting: Obstetrics and Gynecology

## 2024-01-05 ENCOUNTER — Ambulatory Visit: Admitting: Obstetrics and Gynecology

## 2024-01-05 ENCOUNTER — Other Ambulatory Visit: Payer: Self-pay

## 2024-01-05 VITALS — BP 115/70 | HR 90 | Wt 220.0 lb

## 2024-01-05 DIAGNOSIS — L299 Pruritus, unspecified: Secondary | ICD-10-CM

## 2024-01-05 DIAGNOSIS — Z3A35 35 weeks gestation of pregnancy: Secondary | ICD-10-CM | POA: Diagnosis not present

## 2024-01-05 DIAGNOSIS — O0993 Supervision of high risk pregnancy, unspecified, third trimester: Secondary | ICD-10-CM

## 2024-01-05 DIAGNOSIS — Z141 Cystic fibrosis carrier: Secondary | ICD-10-CM

## 2024-01-05 DIAGNOSIS — O0990 Supervision of high risk pregnancy, unspecified, unspecified trimester: Secondary | ICD-10-CM

## 2024-01-05 DIAGNOSIS — O99210 Obesity complicating pregnancy, unspecified trimester: Secondary | ICD-10-CM

## 2024-01-05 DIAGNOSIS — E0590 Thyrotoxicosis, unspecified without thyrotoxic crisis or storm: Secondary | ICD-10-CM

## 2024-01-05 DIAGNOSIS — O09893 Supervision of other high risk pregnancies, third trimester: Secondary | ICD-10-CM | POA: Diagnosis not present

## 2024-01-05 DIAGNOSIS — Z3009 Encounter for other general counseling and advice on contraception: Secondary | ICD-10-CM

## 2024-01-05 DIAGNOSIS — O99213 Obesity complicating pregnancy, third trimester: Secondary | ICD-10-CM | POA: Diagnosis not present

## 2024-01-05 DIAGNOSIS — O99283 Endocrine, nutritional and metabolic diseases complicating pregnancy, third trimester: Secondary | ICD-10-CM

## 2024-01-05 DIAGNOSIS — O468X3 Other antepartum hemorrhage, third trimester: Secondary | ICD-10-CM

## 2024-01-05 DIAGNOSIS — O09892 Supervision of other high risk pregnancies, second trimester: Secondary | ICD-10-CM

## 2024-01-05 NOTE — Progress Notes (Signed)
   PRENATAL VISIT NOTE  Subjective:  Alicia Turner is a 28 y.o. [id number] at [redacted]w[redacted]d being seen today for ongoing prenatal care.  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  Patient reports itching in hands and feet, mostly feet, sometimes hands during the day. Occasional contractions, irregular. Contractions: Not present. Vag. Bleeding: None.  Movement: Present. Denies leaking of fluid.   The following portions of the patient's history were reviewed and updated as appropriate: allergies, current medications, past family history, past medical history, past social history, past surgical history and problem list.   Objective:   Vitals:   01/05/24 1048  BP: 115/70  Pulse: 90  Weight: 220 lb (99.8 kg)    Fetal Status: Fetal Heart Rate (bpm): 147   Movement: Present     General:  Alert, oriented and cooperative. Patient is in no acute distress.  Skin: Skin is warm and dry. No rash noted.   Cardiovascular: Normal heart rate noted  Respiratory: Normal respiratory effort, no problems with respiration noted  Abdomen: Soft, gravid, appropriate for gestational age.  Pain/Pressure: Absent     Pelvic: Cervical exam deferred        Extremities: Normal range of motion.  Edema: None  Mental Status: Normal mood and affect. Normal behavior. Normal judgment and thought content.   Assessment and Plan:  Pregnancy: [id number] at [redacted]w[redacted]d  1. [redacted] weeks gestation of pregnancy (Primary)  2. Supervision of high risk pregnancy, antepartum Reports mood is okay on zoloft and atarax prn  3. Obesity affecting pregnancy, antepartum, unspecified obesity type  4. Cystic fibrosis carrier  in second trimester, antepartum  5. Subchorionic hemorrhage of placenta in third trimester  6. Unwanted fertility Signed BTL papers  7. Hyperthyroidism affecting pregnancy, antepartum Last labs wnl  8. Itching of both hands Last bile acids okay, will repeat today   Preterm labor symptoms and general obstetric precautions including but not limited to vaginal bleeding, contractions, leaking of fluid and fetal movement were reviewed in detail with the patient. Please refer to After Visit Summary for other counseling recommendations.   Return in about 1 week (around 01/12/2024) for high OB.  Future Appointments  Date Time Provider Department Center  01/12/2024 10:15 AM Kendell Bane [facility]  01/26/2024  3:15 PM [facility] NURSE [facility]  01/26/2024  3:30 PM [facility] US4 [facility]    Conan Bowens, MD

## 2024-01-06 LAB — COMPREHENSIVE METABOLIC PANEL WITH GFR
ALT: 10 IU/L (ref 0–32)
AST: 20 IU/L (ref 0–40)
Albumin: 3.6 g/dL — ABNORMAL LOW (ref 4.0–5.0)
Alkaline Phosphatase: 112 IU/L (ref 44–121)
BUN/Creatinine Ratio: 9 (ref 9–23)
BUN: 4 mg/dL — ABNORMAL LOW (ref 6–20)
Bilirubin Total: 0.5 mg/dL (ref 0.0–1.2)
CO2: 21 mmol/L (ref 20–29)
Calcium: 8.9 mg/dL (ref 8.7–10.2)
Chloride: 103 mmol/L (ref 96–106)
Creatinine, Ser: 0.45 mg/dL — ABNORMAL LOW (ref 0.57–1.00)
Globulin, Total: 2.1 g/dL (ref 1.5–4.5)
Glucose: 80 mg/dL (ref 70–99)
Potassium: 4.1 mmol/L (ref 3.5–5.2)
Sodium: 137 mmol/L (ref 134–144)
Total Protein: 5.7 g/dL — ABNORMAL LOW (ref 6.0–8.5)
eGFR: 133 mL/min/{1.73_m2} (ref >59)

## 2024-01-06 LAB — BILE ACIDS, TOTAL: Bile Acids Total: 3.6 umol/L (ref 0.0–10.0)

## 2024-01-08 ENCOUNTER — Encounter: Payer: Self-pay | Admitting: Obstetrics and Gynecology

## 2024-01-10 ENCOUNTER — Other Ambulatory Visit: Payer: Self-pay

## 2024-01-10 ENCOUNTER — Inpatient Hospital Stay (HOSPITAL_COMMUNITY)

## 2024-01-10 ENCOUNTER — Inpatient Hospital Stay (HOSPITAL_COMMUNITY)
Admission: AD | Admit: 2024-01-10 | Discharge: 2024-01-10 | Disposition: A | Discharge status: Home / Self Care | Attending: Obstetrics & Gynecology | Admitting: Obstetrics & Gynecology

## 2024-01-10 ENCOUNTER — Encounter (HOSPITAL_COMMUNITY): Payer: Self-pay | Admitting: Obstetrics & Gynecology

## 2024-01-10 DIAGNOSIS — O4703 False labor before 37 completed weeks of gestation, third trimester: Secondary | ICD-10-CM | POA: Insufficient documentation

## 2024-01-10 DIAGNOSIS — Z3A36 36 weeks gestation of pregnancy: Secondary | ICD-10-CM | POA: Diagnosis not present

## 2024-01-10 DIAGNOSIS — O4700 False labor before 37 completed weeks of gestation, unspecified trimester: Secondary | ICD-10-CM

## 2024-01-10 DIAGNOSIS — O288 Other abnormal findings on antenatal screening of mother: Secondary | ICD-10-CM

## 2024-01-10 NOTE — MAU Note (Signed)
..  Alicia Turner is a 28 y.o. at [redacted]w[redacted]d here in MAU reporting: ctx since yesterday that are now every 9-12 minutes.  Denies vaginal bleeding or leaking of fluid.  Pain score: 5/10 Vitals:   01/10/24 2114 01/10/24 2123  BP: 112/75 117/75  Pulse: 93 76  Resp: 14   Temp: 98.3 F (36.8 C)   SpO2: 100% 98%     FHT: brought directly to room.

## 2024-01-10 NOTE — MAU Provider Note (Signed)
 None      S: Ms. Simmie Garin is a 28 y.o. [id number] at [redacted]w[redacted]d  who presents to MAU today complaining contractions q 9-12 minutes since yesterday. She denies vaginal bleeding. She denies LOF. She reports normal fetal movement.    O: BP 117/75 (BP Location: Left Arm)   Pulse 76   Temp 98.3 F (36.8 C) (Oral)   Resp 14   Ht 5\' 5"  (1.651 m)   Wt 100 kg   LMP 04/21/2023   SpO2 98%   BMI 36.68 kg/m  RN evaluation  Cervical exam:  Dilation: 1.5 Effacement (%): 60 Station: -3 Presentation: Vertex Exam by:: Angelica Pou, RN   Fetal Monitoring: Baseline: 130 Variability: mod Accelerations: absent Decelerations: absent Contractions: irreg   A/P: SIUP at [redacted]w[redacted]d here with preterm contractions NST non-reactive, but BPP obtained and was 8/8 Pt discharged w return precautions outlined   Sundra Aland, MD 01/10/2024 11:21 PM

## 2024-01-12 ENCOUNTER — Other Ambulatory Visit: Payer: Self-pay

## 2024-01-12 ENCOUNTER — Inpatient Hospital Stay (HOSPITAL_COMMUNITY)
Admission: AD | Admit: 2024-01-12 | Discharge: 2024-01-12 | Disposition: A | Discharge status: Home / Self Care | Payer: Self-pay | Attending: Obstetrics and Gynecology | Admitting: Obstetrics and Gynecology

## 2024-01-12 ENCOUNTER — Encounter (HOSPITAL_COMMUNITY): Payer: Self-pay | Admitting: Obstetrics and Gynecology

## 2024-01-12 ENCOUNTER — Ambulatory Visit: Admitting: Advanced Practice Midwife

## 2024-01-12 VITALS — BP 113/76 | HR 78 | Wt 215.5 lb

## 2024-01-12 DIAGNOSIS — Z3689 Encounter for other specified antenatal screening: Secondary | ICD-10-CM | POA: Insufficient documentation

## 2024-01-12 DIAGNOSIS — O0993 Supervision of high risk pregnancy, unspecified, third trimester: Secondary | ICD-10-CM | POA: Diagnosis not present

## 2024-01-12 DIAGNOSIS — O4703 False labor before 37 completed weeks of gestation, third trimester: Secondary | ICD-10-CM | POA: Insufficient documentation

## 2024-01-12 DIAGNOSIS — O368130 Decreased fetal movements, third trimester, not applicable or unspecified: Secondary | ICD-10-CM | POA: Insufficient documentation

## 2024-01-12 DIAGNOSIS — O4700 False labor before 37 completed weeks of gestation, unspecified trimester: Secondary | ICD-10-CM

## 2024-01-12 DIAGNOSIS — Z3A36 36 weeks gestation of pregnancy: Secondary | ICD-10-CM

## 2024-01-12 DIAGNOSIS — O479 False labor, unspecified: Secondary | ICD-10-CM | POA: Diagnosis not present

## 2024-01-12 DIAGNOSIS — O99280 Endocrine, nutritional and metabolic diseases complicating pregnancy, unspecified trimester: Secondary | ICD-10-CM

## 2024-01-12 DIAGNOSIS — O23593 Infection of other part of genital tract in pregnancy, third trimester: Secondary | ICD-10-CM | POA: Diagnosis not present

## 2024-01-12 DIAGNOSIS — E0590 Thyrotoxicosis, unspecified without thyrotoxic crisis or storm: Secondary | ICD-10-CM

## 2024-01-12 DIAGNOSIS — O99283 Endocrine, nutritional and metabolic diseases complicating pregnancy, third trimester: Secondary | ICD-10-CM

## 2024-01-12 DIAGNOSIS — O99213 Obesity complicating pregnancy, third trimester: Secondary | ICD-10-CM | POA: Insufficient documentation

## 2024-01-12 DIAGNOSIS — O0990 Supervision of high risk pregnancy, unspecified, unspecified trimester: Secondary | ICD-10-CM

## 2024-01-12 NOTE — MAU Provider Note (Signed)
 MAU Provider Note  Chief Complaint: Fetal Monitoring   Event Date/Time   First Provider Initiated Contact with Patient 01/12/24 1433      SUBJECTIVE HPI: Alicia Turner is a 28 y.o. Z6X0960 at [redacted]w[redacted]d by early ultrasound who presents to maternity admissions reporting non-reactive NST in office. Pregnancy c/b hyperthyroidism, obesity. Receives [facility] with [facility].  Patient has had DFM over past few days. Initially evaluated in MAU 4/5 for this and preterm contractions. BPP 8/8 and cervix unchanged. She notes that fetal movement has improved since this time (meeting >10 movements in 2 hours), however is still less than his usual. Having irregular, mild contractions. Presented to regularly scheduled office visit today, and NST was non-reactive. Notes losing part of her mucus plug. Denies VB, LOF, change in discharge. Is being treated for BV.   HPI  Past Medical History:  Diagnosis Date   Anemia    Eczema    Erythema nodosum    Hyperglycemia    Hyperthyroidism    Left breast lump    Non-reassuring fetal heart rate or rhythm affecting mother 11/17/2020   Past Surgical History:  Procedure Laterality Date   BREAST BIOPSY Right 06/16/2020   BREAST BIOPSY Left 2019   NO PAST SURGERIES     Social History   Socioeconomic History   Marital status: Single    Spouse name: Not on file   Number of children: Not on file   Years of education: Not on file   Highest education level: Not on file  Occupational History   Not on file  Tobacco Use   Smoking status: Never   Smokeless tobacco: Never  Vaping Use   Vaping status: Never Used  Substance and Sexual Activity   Alcohol use: Not Currently    Comment: occasionally   Drug use: Never   Sexual activity: Not Currently    Birth control/protection: None  Other Topics Concern   Not on file  Social History Narrative   Not on file   Social Drivers of Health   Financial Resource Strain: Low Risk  (11/06/2023)   Received from [facility]    Overall Financial Resource Strain (CARDIA)    Difficulty of Paying Living Expenses: Not hard at all  Food Insecurity: No Food Insecurity (11/06/2023)   Received from [facility]   Hunger Vital Sign    Worried About Running Out of Food in the Last Year: Never true    Ran Out of Food in the Last Year: Never true  Transportation Needs: No Transportation Needs (11/06/2023)   Received from [facility] - Transportation    Lack of Transportation (Medical): No    Lack of Transportation (Non-Medical): No  Physical Activity: Insufficiently Active (08/07/2023)   Received from [facility]   Exercise Vital Sign    Days of Exercise per Week: 3 days    Minutes of Exercise per Session: 30 min  Stress: No Stress Concern Present (08/07/2023)   Received from [facility] - Occupational Stress Questionnaire    Feeling of Stress : Not at all  Social Connections: Socially Integrated (08/07/2023)   Received from [facility]   Social Network    How would you rate your social network (family, work, friends)?: Good participation with social networks  Intimate Partner Violence: Not At Risk (08/07/2023)   Received from [facility]   Humiliation, Afraid, Rape, and Kick questionnaire    Fear of Current or Ex-Partner: No  Emotionally Abused: No    Physically Abused: No    Sexually Abused: No   No current facility-administered medications on file prior to encounter.   Current Outpatient Medications on File Prior to Encounter  Medication Sig Dispense Refill   ferrous sulfate 325 (65 FE) MG tablet Take 1 tablet (325 mg total) by mouth daily with breakfast. 90 tablet 1   hydrOXYzine (ATARAX) 25 MG tablet Take 1 tablet (25 mg total) by mouth at bedtime. 30 tablet 1   pantoprazole (PROTONIX) 40 MG tablet Take 1 tablet (40 mg total) by mouth daily. 30 tablet 2   Prenatal Vit-Fe Fumarate-FA (PRENATAL VITAMIN PO) Take 1 tablet by mouth daily.      sertraline (ZOLOFT) 50 MG tablet Take 100 mg by mouth daily.     VITAMIN D PO Take by mouth.     acetaminophen (TYLENOL) 325 MG tablet Take 2 tablets (650 mg total) by mouth every 4 (four) hours as needed (for pain scale < 4). (Patient not taking: Reported on 12/16/2023) 30 tablet 0   albuterol (VENTOLIN HFA) 108 (90 Base) MCG/ACT inhaler Inhale 1-2 puffs into the lungs every 6 (six) hours as needed for wheezing or shortness of breath. (Patient not taking: Reported on 09/01/2023) 8 g 1   polyethylene glycol powder (GLYCOLAX/MIRALAX) 17 GM/SCOOP powder Take 17 g by mouth daily as needed. (Patient not taking: Reported on 12/16/2023) 510 g 1   No Known Allergies  ROS:  Pertinent positives/negatives listed above.  I have reviewed patient's Past Medical Hx, Surgical Hx, Family Hx, Social Hx, medications and allergies.   Physical Exam  No data found. Constitutional: Well-developed, well-nourished female in no acute distress  Cardiovascular: normal rate Respiratory: normal effort GI: Abd soft, non-tender, gravid MS: Extremities nontender, no edema, normal ROM Neurologic: Alert and oriented x 4   FHT:  Baseline 125, moderate variability, 15x15 accelerations present, no decelerations Contractions: UI/irregular  Cervix 2/50/ballotable  LAB RESULTS No results found for this or any previous visit (from the past 24 hours).  --/--/O POS (02/05 2036)  IMAGING [place] MFM FETAL BPP WO NON STRESS Result Date: 01/11/2024 ----------------------------------------------------------------------  OBSTETRICS REPORT                       (Signed Final 01/11/2024 08:40 am) ---------------------------------------------------------------------- Patient Info  ID #:       [id number]                          D.O.B.:  1996/01/10 (28 yrs)(F)  Name:       Alicia Turner                    Visit Date: 01/10/2024 10:41 pm ---------------------------------------------------------------------- Performed By  Attending:        Ma Rings MD         Referred By:      [facility] MAU/Triage  Performed By:     Earley Brooke     [facility] ---------------------------------------------------------------------- Orders  #  Description  Code        Ordered By  1  [place] MFM FETAL BPP WO NON               [id number]    AGNIJITA [id number]     STRESS ----------------------------------------------------------------------  #  Order #                     Accession #                Episode #  1  [id number]                   [id number]                 [id number] ---------------------------------------------------------------------- Indications  Non-reactive NST                               O28.9  [redacted] weeks gestation of pregnancy                Z3A.36  Preterm contractions                           O47.00 ---------------------------------------------------------------------- Fetal Evaluation  Num Of Fetuses:         1  Fetal Heart Rate(bpm):  134  Cardiac Activity:       Observed  Presentation:           Cephalic  Placenta:               Anterior  P. Cord Insertion:      Previously seen as normal  Amniotic Fluid  AFI FV:      Within normal limits  AFI Sum(cm)     %Tile       Largest Pocket(cm)  14.5            53          5.5  RUQ(cm)       RLQ(cm)       LUQ(cm)        LLQ(cm)  2.6           4.4           2              5.5 ---------------------------------------------------------------------- Biophysical Evaluation  Amniotic F.V:   Within normal limits       F. Tone:        Observed  F. Movement:    Observed                   Score:          8/8  F. Breathing:   Observed ---------------------------------------------------------------------- OB History  Gravidity:    6         Term:   2         SAB:   3  Living:       2 ---------------------------------------------------------------------- Gestational Age  LMP:           37w 5d        Date:  04/21/23                   EDD:   01/26/24  Best:          36w 1d     Det. By:  Marcella Dubs         EDD:  02/06/24                                      (06/13/23) ---------------------------------------------------------------------- Comments  This patient presented to the MAU due to frequent  contractions.  She had a nonreactive NST.  A biophysical profile performed today was 8/8.  There was normal amniotic fluid with a total AFI of 14.5 cm.  The fetus is in the vertex presentation. ----------------------------------------------------------------------                  Ma Rings, MD Electronically Signed Final Report   01/11/2024 08:40 am ----------------------------------------------------------------------   [place] MFM FETAL BPP WO NON STRESS Result Date: 12/21/2023 ----------------------------------------------------------------------  OBSTETRICS REPORT                        (Signed Final 12/21/2023 08:24 am) ---------------------------------------------------------------------- Patient Info  ID #:       [id number]                          D.O.B.:  1996/05/24 (28 yrs)(F)  Name:       Alicia Turner                    Visit Date: 12/20/2023 06:53 pm ---------------------------------------------------------------------- Performed By  Attending:        Ma Rings MD         Ref. Address:      [address]                                                              [place], [place]                                                              [address]  Performed By:     Louie Casa       Secondary Phy.:    [facility] MAU/Triage                    RDMS  Referred By:      [facility]          Location[facility] ---------------------------------------------------------------------- Orders  #  Description                           Code        Ordered By  1  [place] MFM FETAL BPP WO NON               [id number]    SHANTONETTE  STRESS                                             PAYNE ----------------------------------------------------------------------  #  Order #                     Accession #                Episode #  1  [id number]                   [id number]                 [id number] ---------------------------------------------------------------------- Indications  Decreased fetal movement                        O36.8190  Obesity complicating pregnancy, third           O99.213  trimester  [redacted] weeks gestation of pregnancy                 Z3A.33 ---------------------------------------------------------------------- Fetal Evaluation  Num Of Fetuses:          1  Fetal Heart Rate(bpm):   141  Cardiac Activity:        Observed  Presentation:            Cephalic  Placenta:                Anterior  P. Cord Insertion:       Visualized, central  Amniotic Fluid  AFI FV:      Within normal limits  AFI Sum(cm)     %Tile       Largest Pocket(cm)  14.1            49          6.3  RUQ(cm)       RLQ(cm)                      LLQ(cm)  6.3           5.3                          2.5 ---------------------------------------------------------------------- Biophysical Evaluation  Amniotic F.V:   Pocket => 2 cm             F. Tone:         Observed  F. Movement:    Observed                   Score:           8/8  F. Breathing:   Observed ---------------------------------------------------------------------- OB History  Gravidity:    6         Term:   2         SAB:   3  Living:       2 ---------------------------------------------------------------------- Gestational Age  LMP:           34w 5d        Date:  04/21/23                  EDD:   01/26/24  Best:          33w 1d     Det. By:  Marcella Dubs  EDD:   02/06/24                                      (06/13/23) ---------------------------------------------------------------------- Anatomy  Ventricles:            Appears normal         Stomach:                Not well visualized  Choroid Plexus:        Appears normal         Kidneys:                 Appear normal  Heart:                 Appears normal         Bladder:                Appears normal                         (4CH, axis, and                         situs)  Diaphragm:             Appears normal ---------------------------------------------------------------------- Comments  This patient presented to the MAU due to decreased fetal  movements and contractions.  A biophysical profile performed today was 8/8.  The AFI was 14.1 cm (within normal limits). ----------------------------------------------------------------------                   Ma Rings, MD Electronically Signed Final Report   12/21/2023 08:24 am ----------------------------------------------------------------------   [place] MFM OB FOLLOW UP Result Date: 12/16/2023 ----------------------------------------------------------------------  OBSTETRICS REPORT                       (Signed Final 12/16/2023 12:41 pm) ---------------------------------------------------------------------- Patient Info  ID #:       [id number]                          D.O.B.:  1996-05-25 (28 yrs)(F)  Name:       Alicia Turner                    Visit Date: 12/16/2023 12:06 pm ---------------------------------------------------------------------- Performed By  Attending:        Braxton Feathers DO       Ref. Address:     [address]                                                             [place], [place]                                                             [address]  Performed By:     Octaviano Batty BS       [facility]  Referred By:      [facility] ---------------------------------------------------------------------- Orders  #  Description                           Code        Ordered By   1  [place] MFM OB FOLLOW UP                   [phone]    Noralee Space ----------------------------------------------------------------------  #  Order #                     Accession #                Episode #  1  [id number]                   [id number]                 [id number] ---------------------------------------------------------------------- Indications  Obesity complicating pregnancy, third          O99.213  trimester (BMI 33)  Cystic Fibrosis (CF) Carrier, third trimester  O09.893  Hyperthyroid                                   O99.280 E05.90  Encounter for other antenatal screening        Z36.2  follow-up  LR female  [redacted] weeks gestation of pregnancy                Z3A.32 ---------------------------------------------------------------------- Fetal Evaluation  Num Of Fetuses:         1  Fetal Heart Rate(bpm):  150  Cardiac Activity:       Observed  Presentation:           Cephalic  Placenta:               Anterior  P. Cord Insertion:      Previously seen  Amniotic Fluid  AFI FV:      Subjectively upper-normal  AFI Sum(cm)     %Tile       Largest Pocket(cm)  22.23           85  8.75  RUQ(cm)       RLQ(cm)       LUQ(cm)        LLQ(cm)  3.61          4.54          8.75           5.33 ---------------------------------------------------------------------- Biometry  BPD:      81.7  mm     G. Age:  32w 6d         50  %    CI:        74.78   %    70 - 86                                                          FL/HC:      21.0   %    19.9 - 21.5  HC:      299.8  mm     G. Age:  33w 2d         29  %    HC/AC:      1.02        0.96 - 1.11  AC:      293.4  mm     G. Age:  33w 2d         72  %    FL/BPD:     77.0   %    71 - 87  FL:       62.9  mm     G. Age:  32w 4d         37  %    FL/AC:      21.4   %    20 - 24  Est. FW:    2108  gm    4 lb 10 oz      55  % ---------------------------------------------------------------------- OB History  Gravidity:    6         Term:   2         SAB:   3  Living:       2  ---------------------------------------------------------------------- Gestational Age  LMP:           34w 1d        Date:  04/21/23                  EDD:   01/26/24  U/S Today:     33w 0d                                        EDD:   02/03/24  Best:          32w 4d     Det. ByMarcella Dubs         EDD:   02/06/24                                      (06/13/23) ---------------------------------------------------------------------- Anatomy  Ventricles:            Appears normal         Stomach:  Appears normal, left                                                                        sided  Heart:                 Appears normal         Kidneys:                Appear normal                         (4CH, axis, and                         situs)  Diaphragm:             Appears normal         Bladder:                Appears normal  Other:  A full anatomic study was previously performed. ---------------------------------------------------------------------- Cervix Uterus Adnexa  Cervix  Not visualized (advanced GA >24wks) ---------------------------------------------------------------------- Comments  The patient is here for ultrasound. Her pregnancy is  complicated by graves disease that is controlled w/o  medications. She has no further concerns today.  Sonographic findings  Single intrauterine pregnancy at 32w 4d.  Fetal cardiac activity:  Observed and appears normal.  Presentation: Cephalic.  Interval fetal anatomy appears normal.  Fetal biometry shows the estimated fetal weight at the 55  percentile.  Amniotic fluid volume: Subjectively upper-normal. MVP: 8.75  cm.  Placenta: Anterior.  Recommendations  - Serial growth ultrasounds every 4-6 weeks until delivery  - Delivery around EDD.  There are limitations of prenatal ultrasound such as the  inability to detect certain abnormalities due to poor  visualization. Various factors such as fetal position,  gestational age and maternal body habitus may  increase the  difficulty in visualizing the fetal anatomy. ----------------------------------------------------------------------                  Braxton Feathers, DO Electronically Signed Final Report   12/16/2023 12:41 pm ----------------------------------------------------------------------    MAU Management/MDM: Orders Placed This Encounter  Procedures   Discharge patient    No orders of the defined types were placed in this encounter.    Available prenatal records reviewed.  Patient presents from office for non-reactive NST. Notes >10 movements in 2 hours consistently. Extended fetal monitoring occurred for ~1 hour. Started to have 15x15 accelerations after drinking ice water/snack. Given BPP 8/8 4/5 and currently reactive tracing, reassuring fetal status at this time. Patient did request a cervical exam given irregular contractions, now 2/50/-3 which is largely unchanged from 2 days ago.  ASSESSMENT 1. NST (non-stress test) reactive   2. Irregular contractions   3. [redacted] weeks gestation of pregnancy     PLAN Discharge home with strict return precautions. Allergies as of 01/12/2024   No Known Allergies      Medication List     STOP taking these medications    acetaminophen 325 MG tablet Commonly known as: Tylenol   albuterol 108 (90 Base) MCG/ACT inhaler Commonly known as: VENTOLIN HFA   polyethylene glycol powder 17 GM/SCOOP powder  Commonly known as: GLYCOLAX/MIRALAX       TAKE these medications    ferrous sulfate 325 (65 FE) MG tablet Take 1 tablet (325 mg total) by mouth daily with breakfast.   hydrOXYzine 25 MG tablet Commonly known as: ATARAX Take 1 tablet (25 mg total) by mouth at bedtime.   pantoprazole 40 MG tablet Commonly known as: Protonix Take 1 tablet (40 mg total) by mouth daily.   PRENATAL VITAMIN PO Take 1 tablet by mouth daily.   sertraline 50 MG tablet Commonly known as: ZOLOFT Take 100 mg by mouth daily.   VITAMIN D PO Take by mouth.          Wylene Simmer, MD OB Fellow 01/12/2024  3:19 PM

## 2024-01-12 NOTE — Progress Notes (Signed)
   PRENATAL VISIT NOTE  Subjective:  Alicia Turner is a 28 y.o. Z6X0960 at [redacted]w[redacted]d being seen today for ongoing prenatal care.  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  Patient reports  concerns about baby since she had DFM a few days ago, questions about FHR tracing at hospital .  Contractions: Irregular.  .  Movement: Present. Denies leaking of fluid.   The following portions of the patient's history were reviewed and updated as appropriate: allergies, current medications, past family history, past medical history, past social history, past surgical history and problem list.   Objective:   Vitals:   01/12/24 1058  BP: 113/76  Pulse: 78  Weight: 215 lb 8 oz (97.8 kg)    Fetal Status: Fetal Heart Rate (bpm): 150   Movement: Present     General:  Alert, oriented and cooperative. Patient is in no acute distress.  Skin: Skin is warm and dry. No rash noted.   Cardiovascular: Normal heart rate noted  Respiratory: Normal respiratory effort, no problems with respiration noted  Abdomen: Soft, gravid, appropriate for gestational age.  Pain/Pressure: Present     Pelvic: Cervical exam deferred        Extremities: Normal range of motion.  Edema: None  Mental Status: Normal mood and affect. Normal behavior. Normal judgment and thought content.   Assessment and Plan:  Pregnancy: A5W0981 at [redacted]w[redacted]d 1. Supervision of high risk pregnancy, antepartum --Anticipatory guidance about next visits/weeks of pregnancy given.   2. Decreased fetal movements in third trimester, single or unspecified fetus (Primary) --Pt is feeling movement today but was seen in MAU on 4/5 and  had to have a BPP due to nonreactive NST and she reports intermittent DFM since then.   --She is concerned about the baby today --NST in office, 10 x 10 accels but no 15 x 15 accels, sent to MAU for extended monitoring/BPP if needed   3. [redacted] weeks gestation of pregnancy  4. Preterm contractions --Pt with irregular contractions, but episodes of frequent regular contractions that resolve --PTL/labor precautions reviewed  5. Hyperthyroidism affecting pregnancy, antepartum --labs wnl on 3/26  Preterm labor symptoms and general obstetric precautions including but not limited to vaginal bleeding, contractions, leaking of fluid and fetal movement were reviewed in detail with the patient. Please refer to After Visit Summary for other counseling recommendations.   No follow-ups on file.  Future Appointments  Date Time Provider Department Center  01/21/2024 10:15 AM Adam Phenix, MD [facility]  01/26/2024  3:15 PM [facility]  01/26/2024  3:30 PM [facility] US4 [facility]    Sharen Counter, CNM

## 2024-01-12 NOTE — MAU Note (Signed)
.  Alicia Turner is a 28 y.o. at [redacted]w[redacted]d here in MAU reporting: Sent over from the office for extended fetal monitoring. Per CNM, patient was evaluated on 4/5 for DFM and had a non reactive NST with a BPP of 8/8. Patient reports feeling more fetal movement butvoiced concerns about her baby. CNM obtained NST in office. 10x10's noted without 15x15's. Sent over for further evaluation.  Onset of complaint: Today Pain score: Denies pain.  FHT: 150 initial external Lab orders placed from triage: none

## 2024-01-20 ENCOUNTER — Ambulatory Visit

## 2024-01-21 ENCOUNTER — Other Ambulatory Visit (HOSPITAL_COMMUNITY)
Admission: RE | Admit: 2024-01-21 | Discharge: 2024-01-21 | Disposition: A | Discharge status: Home / Self Care | Source: Ambulatory Visit | Attending: Obstetrics & Gynecology | Admitting: Obstetrics & Gynecology

## 2024-01-21 ENCOUNTER — Ambulatory Visit: Admitting: Obstetrics & Gynecology

## 2024-01-21 VITALS — BP 112/78 | HR 83 | Wt 219.2 lb

## 2024-01-21 DIAGNOSIS — O0990 Supervision of high risk pregnancy, unspecified, unspecified trimester: Secondary | ICD-10-CM | POA: Diagnosis present

## 2024-01-21 DIAGNOSIS — Z3A37 37 weeks gestation of pregnancy: Secondary | ICD-10-CM

## 2024-01-21 DIAGNOSIS — E0590 Thyrotoxicosis, unspecified without thyrotoxic crisis or storm: Secondary | ICD-10-CM

## 2024-01-21 DIAGNOSIS — O0993 Supervision of high risk pregnancy, unspecified, third trimester: Secondary | ICD-10-CM | POA: Insufficient documentation

## 2024-01-21 DIAGNOSIS — O99283 Endocrine, nutritional and metabolic diseases complicating pregnancy, third trimester: Secondary | ICD-10-CM

## 2024-01-21 NOTE — Progress Notes (Signed)
   PRENATAL VISIT NOTE  Subjective:  Alicia Turner is a 28 y.o. [id number] at [redacted]w[redacted]d being seen today for ongoing prenatal care.  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  Patient reports occasional contractions.  Contractions: Irregular.  .  Movement: Present. Denies leaking of fluid.   The following portions of the patient's history were reviewed and updated as appropriate: allergies, current medications, past family history, past medical history, past social history, past surgical history and problem list.   Objective:   Vitals:   01/21/24 1056  BP: 112/78  Pulse: 83  Weight: 219 lb 4 oz (99.5 kg)    Fetal Status:     Movement: Present  Presentation: Vertex  General:  Alert, oriented and cooperative. Patient is in no acute distress.  Skin: Skin is warm and dry. No rash noted.   Cardiovascular: Normal heart rate noted  Respiratory: Normal respiratory effort, no problems with respiration noted  Abdomen: Soft, gravid, appropriate for gestational age.  Pain/Pressure: Present     Pelvic: Cervical exam performed in the presence of a chaperone Dilation: 2 Effacement (%): 50 Station: Ballotable  Extremities: Normal range of motion.  Edema: None  Mental Status: Normal mood and affect. Normal behavior. Normal judgment and thought content.   Assessment and Plan:  Pregnancy: [id number] at [redacted]w[redacted]d 1. [redacted] weeks gestation of pregnancy (Primary) [redacted]w[redacted]d   2. Supervision of high risk pregnancy, antepartum   3. Hyperthyroidism affecting pregnancy, antepartum   Term labor symptoms and general obstetric precautions including but not limited to vaginal bleeding,  contractions, leaking of fluid and fetal movement were reviewed in detail with the patient. Please refer to After Visit Summary for other counseling recommendations.   Return in about 1 week (around 01/28/2024).  Future Appointments  Date Time Provider Department Center  01/26/2024  3:15 PM [facility] NURSE [facility]  01/26/2024  3:30 PM [facility]    Onnie Bilis, MD

## 2024-01-22 LAB — CERVICOVAGINAL ANCILLARY ONLY
Bacterial Vaginitis (gardnerella): NEGATIVE
Candida Glabrata: NEGATIVE
Candida Vaginitis: NEGATIVE
Chlamydia: NEGATIVE
Comment: NEGATIVE
Comment: NEGATIVE
Comment: NEGATIVE
Comment: NEGATIVE
Comment: NEGATIVE
Comment: NORMAL
Neisseria Gonorrhea: NEGATIVE
Trichomonas: NEGATIVE

## 2024-01-24 ENCOUNTER — Encounter: Payer: Self-pay | Admitting: Obstetrics & Gynecology

## 2024-01-25 LAB — CULTURE, BETA STREP (GROUP B ONLY): Strep Gp B Culture: NEGATIVE

## 2024-01-26 ENCOUNTER — Ambulatory Visit

## 2024-01-26 ENCOUNTER — Other Ambulatory Visit: Payer: Self-pay

## 2024-01-26 ENCOUNTER — Inpatient Hospital Stay (HOSPITAL_COMMUNITY)
Admission: AD | Admit: 2024-01-26 | Discharge: 2024-01-27 | DRG: 807 | Disposition: A | Discharge status: Home / Self Care | Payer: Self-pay | Attending: Obstetrics and Gynecology | Admitting: Obstetrics and Gynecology

## 2024-01-26 ENCOUNTER — Encounter (HOSPITAL_COMMUNITY): Payer: Self-pay | Admitting: Obstetrics and Gynecology

## 2024-01-26 ENCOUNTER — Ambulatory Visit (HOSPITAL_BASED_OUTPATIENT_CLINIC_OR_DEPARTMENT_OTHER): Admitting: Obstetrics

## 2024-01-26 VITALS — BP 109/64 | HR 79

## 2024-01-26 DIAGNOSIS — E0590 Thyrotoxicosis, unspecified without thyrotoxic crisis or storm: Secondary | ICD-10-CM | POA: Diagnosis present

## 2024-01-26 DIAGNOSIS — Z141 Cystic fibrosis carrier: Secondary | ICD-10-CM

## 2024-01-26 DIAGNOSIS — O4103X0 Oligohydramnios, third trimester, not applicable or unspecified: Secondary | ICD-10-CM

## 2024-01-26 DIAGNOSIS — Z8249 Family history of ischemic heart disease and other diseases of the circulatory system: Secondary | ICD-10-CM | POA: Diagnosis not present

## 2024-01-26 DIAGNOSIS — O4100X0 Oligohydramnios, unspecified trimester, not applicable or unspecified: Secondary | ICD-10-CM | POA: Diagnosis present

## 2024-01-26 DIAGNOSIS — O09893 Supervision of other high risk pregnancies, third trimester: Secondary | ICD-10-CM | POA: Insufficient documentation

## 2024-01-26 DIAGNOSIS — E669 Obesity, unspecified: Secondary | ICD-10-CM

## 2024-01-26 DIAGNOSIS — O99213 Obesity complicating pregnancy, third trimester: Secondary | ICD-10-CM | POA: Diagnosis not present

## 2024-01-26 DIAGNOSIS — O99283 Endocrine, nutritional and metabolic diseases complicating pregnancy, third trimester: Secondary | ICD-10-CM | POA: Diagnosis not present

## 2024-01-26 DIAGNOSIS — O0990 Supervision of high risk pregnancy, unspecified, unspecified trimester: Principal | ICD-10-CM

## 2024-01-26 DIAGNOSIS — Z3A38 38 weeks gestation of pregnancy: Secondary | ICD-10-CM

## 2024-01-26 DIAGNOSIS — Z79899 Other long term (current) drug therapy: Secondary | ICD-10-CM

## 2024-01-26 DIAGNOSIS — O99284 Endocrine, nutritional and metabolic diseases complicating childbirth: Secondary | ICD-10-CM | POA: Diagnosis present

## 2024-01-26 DIAGNOSIS — Z833 Family history of diabetes mellitus: Secondary | ICD-10-CM

## 2024-01-26 LAB — CBC
HCT: 34.5 % — ABNORMAL LOW (ref 36.0–46.0)
Hemoglobin: 11.7 g/dL — ABNORMAL LOW (ref 12.0–15.0)
MCH: 29.5 pg (ref 26.0–34.0)
MCHC: 33.9 g/dL (ref 30.0–36.0)
MCV: 87.1 fL (ref 80.0–100.0)
Platelets: 220 10*3/uL (ref 150–400)
RBC: 3.96 MIL/uL (ref 3.87–5.11)
RDW: 13.5 % (ref 11.5–15.5)
WBC: 8.7 10*3/uL (ref 4.0–10.5)
nRBC: 0 % (ref 0.0–0.2)

## 2024-01-26 LAB — TYPE AND SCREEN
ABO/RH(D): O POS
Antibody Screen: NEGATIVE

## 2024-01-26 MED ORDER — LACTATED RINGERS IV SOLN: INTRAVENOUS | Status: DC

## 2024-01-26 MED ORDER — LACTATED RINGERS IV SOLN: 500.0000 mL | INTRAVENOUS | Status: DC | PRN

## 2024-01-26 MED ORDER — OXYTOCIN-SODIUM CHLORIDE 30-0.9 UT/500ML-% IV SOLN: 1.0000 m[IU]/min | INTRAVENOUS | Status: DC

## 2024-01-26 MED ORDER — OXYTOCIN 10 UNIT/ML IJ SOLN
INTRAMUSCULAR | Status: AC
  Administered 2024-01-26: 10 [IU]
  Filled 2024-01-26: qty 1

## 2024-01-26 MED ORDER — LIDOCAINE HCL (PF) 1 % IJ SOLN: 30.0000 mL | INTRAMUSCULAR | Status: DC | PRN

## 2024-01-26 MED ORDER — ONDANSETRON HCL 4 MG/2ML IJ SOLN: 4.0000 mg | Freq: Four times a day (QID) | INTRAMUSCULAR | Status: DC | PRN

## 2024-01-26 MED ORDER — OXYTOCIN BOLUS FROM INFUSION: 333.0000 mL | Freq: Once | INTRAVENOUS | Status: DC

## 2024-01-26 MED ORDER — LACTATED RINGERS AMNIOINFUSION: INTRAVENOUS | Status: DC

## 2024-01-26 MED ORDER — TERBUTALINE SULFATE 1 MG/ML IJ SOLN: 0.2500 mg | Freq: Once | INTRAMUSCULAR | Status: DC | PRN

## 2024-01-26 MED ORDER — ACETAMINOPHEN 325 MG PO TABS: 650.0000 mg | ORAL_TABLET | ORAL | Status: DC | PRN

## 2024-01-26 MED ORDER — SOD CITRATE-CITRIC ACID 500-334 MG/5ML PO SOLN: 30.0000 mL | ORAL | Status: DC | PRN

## 2024-01-26 MED ORDER — OXYTOCIN-SODIUM CHLORIDE 30-0.9 UT/500ML-% IV SOLN
1.0000 m[IU]/min | INTRAVENOUS | Status: DC
  Administered 2024-01-26: 2 m[IU]/min via INTRAVENOUS

## 2024-01-26 MED ORDER — OXYTOCIN-SODIUM CHLORIDE 30-0.9 UT/500ML-% IV SOLN
2.5000 [IU]/h | INTRAVENOUS | Status: DC
  Filled 2024-01-26: qty 500

## 2024-01-26 MED ORDER — OXYTOCIN 10 UNIT/ML IJ SOLN: 10.0000 [IU] | Freq: Once | INTRAMUSCULAR | Status: DC

## 2024-01-26 NOTE — Progress Notes (Signed)
 MFM Consult Note  Alicia Turner is currently at 38 weeks and 3 days.  She has been followed due to maternal obesity and history of hyperthyroidism.  She denies any problems since her last exam.  The patient reports that her cervix was 2 cm dilated recently.  On today's exam, the overall EFW measures 6 pounds 7 ounces (17th percentile).    Oligohydramnios with a total AFI of 4.37 cm is noted.    The fetus is in the vertex presentation.  Due to oligohydramnios at her current gestational age, delivery is recommended.    The patient will go home to get her things and will await a phone call from labor and delivery for induction sometime tonight.    The patient is happy and agreeable with proceeding with delivery today.    She stated that all of her questions were answered.  A total of 20 minutes was spent counseling and coordinating the care for this patient.  Greater than 50% of the time was spent in direct face-to-face contact.

## 2024-01-26 NOTE — Progress Notes (Signed)
 Patient ID: Alicia Turner, female   DOB: January 01, 1996, 28 y.o.   MRN: [id number] Seen at 2115  AROM scant fluid IUPC inserted   Variable decels with each contraction, some mild some moderate. Good variability between with accels  Will Start Amnioinfusion and observe

## 2024-01-26 NOTE — H&P (Signed)
 LABOR AND DELIVERY ADMISSION HISTORY AND PHYSICAL NOTE  Alicia Turner is a 28 y.o. female [phone] with IUP at [redacted]w[redacted]d by early u/s presenting for iol for oligo.   She reports positive fetal movement. She denies leakage of fluid or vaginal bleeding.  Prenatal History/Complications:  Past Medical History: Past Medical History:  Diagnosis Date   Anemia    Eczema    Erythema nodosum    Hyperglycemia    Hyperthyroidism    Left breast lump    Non-reassuring fetal heart rate or rhythm affecting mother 11/17/2020    Past Surgical History: Past Surgical History:  Procedure Laterality Date   BREAST BIOPSY Right 06/16/2020   BREAST BIOPSY Left 2019   NO PAST SURGERIES      Obstetrical History: OB History     Gravida  6   Para  2   Term  2   Preterm  0   AB  3   Living  2      SAB  3   IAB      Ectopic      Multiple      Live Births  2           Social History: Social History   Socioeconomic History   Marital status: Single    Spouse name: Not on file   Number of children: Not on file   Years of education: Not on file   Highest education level: Not on file  Occupational History   Not on file  Tobacco Use   Smoking status: Never   Smokeless tobacco: Never  Vaping Use   Vaping status: Never Used  Substance and Sexual Activity   Alcohol use: Not Currently    Comment: occasionally   Drug use: Never   Sexual activity: Not Currently    Birth control/protection: None  Other Topics Concern   Not on file  Social History Narrative   Not on file   Social Drivers of Health   Financial Resource Strain: Low Risk  (11/06/2023)   Received [facility]   Overall Financial Resource Strain (CARDIA)    Difficulty of Paying Living Expenses: Not hard at all  Food Insecurity: No Food Insecurity (01/26/2024)   Hunger Vital Sign    Worried About Running Out of Food in the Last Year: Never true    Ran Out of Food in the Last Year: Never true  Transportation  Needs: No Transportation Needs (01/26/2024)   PRAPARE - Administrator, Civil Service (Medical): No    Lack of Transportation (Non-Medical): No  Physical Activity: Insufficiently Active (08/07/2023)   Received [facility]   Exercise Vital Sign    Days of Exercise per Week: 3 days    Minutes of Exercise per Session: 30 min  Stress: No Stress Concern Present (08/07/2023)   Received from [facility] - Occupational Stress Questionnaire    Feeling of Stress : Not at all  Social Connections: Socially Integrated (08/07/2023)   Received from [facility]   Social Network    How would you rate your social network (family, work, friends)?: Good participation with social networks    Family History: Family History  Problem Relation Age of Onset   Arthritis Mother        rheumatoid   Hypertension Mother    Hyperlipidemia Mother    Diabetes Mellitus II Mother    Healthy Father    Thyroid  cancer Maternal  Aunt     Allergies: No Known Allergies  Medications Prior to Admission  Medication Sig Dispense Refill Last Dose/Taking   ferrous sulfate  325 (65 FE) MG tablet Take 1 tablet (325 mg total) by mouth daily with breakfast. 90 tablet 1    hydrOXYzine  (ATARAX ) 25 MG tablet Take 1 tablet (25 mg total) by mouth at bedtime. 30 tablet 1    pantoprazole  (PROTONIX ) 40 MG tablet Take 1 tablet (40 mg total) by mouth daily. 30 tablet 2    Prenatal Vit-Fe Fumarate-FA (PRENATAL VITAMIN PO) Take 1 tablet by mouth daily.      sertraline (ZOLOFT) 50 MG tablet Take 100 mg by mouth daily.      VITAMIN D PO Take by mouth.        Review of Systems   All systems reviewed and negative except as stated in HPI  Blood pressure 120/74, pulse 92, temperature 98.2 F (36.8 C), temperature source Oral, resp. rate 16, height 5' 5.5" (1.664 m), weight 100.8 kg, last menstrual period 04/21/2023, unknown if currently breastfeeding. General appearance:  alert, cooperative, and appears stated age Lungs: clear to auscultation bilaterally Heart: regular rate and rhythm Abdomen: soft, non-tender; bowel sounds normal Extremities: No calf swelling or tenderness Presentation: cephalic Fetal monitoring: 145/mod/-a/two lates Uterine activity: q 5     Prenatal labs: ABO, Rh: --/--/O POS (02/05 2036) Antibody:  neg Rubella: Immune (09/06 0000) RPR: Non Reactive (02/10 1219)  HBsAg: Negative (09/06 0000)  HIV: Non Reactive (02/10 1219)  GBS: Negative/-- (04/16 1152)  2 hr Glucola: wnl Genetic screening:  cf carrier (father neg Anatomy US : wnl  Prenatal Transfer Tool  Maternal Diabetes: No Genetic Screening: cf carrier Maternal Ultrasounds/Referrals: oligo Fetal Ultrasounds or other Referrals:  None Maternal Substance Abuse:  No Significant Maternal Medications:  None Significant Maternal Lab Results: Group B Strep negative  No results found for this or any previous visit (from the past 24 hours).  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 [id number] at [redacted]w[redacted]d here for iol for new oligo.  #Labor:foley placed for ripening, probable arom with placemnet of foley, low dose pit started #Pain: Iv pain meds for now #FWB: Cat 1 #ID:  Gbs neg #MOF: breast #MOC:tubal, papers are signed #Circ:  Yes, inpt  #hyperthyroid: not on meds  Alicia Turner 01/26/2024, 7:31 PM

## 2024-01-27 ENCOUNTER — Encounter (HOSPITAL_COMMUNITY): Payer: Self-pay | Admitting: Anesthesiology

## 2024-01-27 ENCOUNTER — Encounter (HOSPITAL_COMMUNITY): Payer: Self-pay | Admitting: Obstetrics and Gynecology

## 2024-01-27 ENCOUNTER — Ambulatory Visit (HOSPITAL_COMMUNITY): Payer: Self-pay

## 2024-01-27 ENCOUNTER — Other Ambulatory Visit (HOSPITAL_COMMUNITY): Payer: Self-pay

## 2024-01-27 LAB — RPR: RPR Ser Ql: NONREACTIVE

## 2024-01-27 MED ORDER — COCONUT OIL OIL: 1.0000 | TOPICAL_OIL | Status: DC | PRN

## 2024-01-27 MED ORDER — ZOLPIDEM TARTRATE 5 MG PO TABS: 5.0000 mg | ORAL_TABLET | Freq: Every evening | ORAL | Status: DC | PRN

## 2024-01-27 MED ORDER — TETANUS-DIPHTH-ACELL PERTUSSIS 5-2.5-18.5 LF-MCG/0.5 IM SUSY: 0.5000 mL | PREFILLED_SYRINGE | Freq: Once | INTRAMUSCULAR | Status: DC

## 2024-01-27 MED ORDER — SIMETHICONE 80 MG PO CHEW: 80.0000 mg | CHEWABLE_TABLET | ORAL | Status: DC | PRN

## 2024-01-27 MED ORDER — BENZOCAINE-MENTHOL 20-0.5 % EX AERO: 1.0000 | INHALATION_SPRAY | CUTANEOUS | Status: DC | PRN

## 2024-01-27 MED ORDER — DIPHENHYDRAMINE HCL 25 MG PO CAPS: 25.0000 mg | ORAL_CAPSULE | Freq: Four times a day (QID) | ORAL | Status: DC | PRN

## 2024-01-27 MED ORDER — ONDANSETRON HCL 4 MG PO TABS: 4.0000 mg | ORAL_TABLET | ORAL | Status: DC | PRN

## 2024-01-27 MED ORDER — IBUPROFEN 600 MG PO TABS
600.0000 mg | ORAL_TABLET | Freq: Four times a day (QID) | ORAL | 0 refills | Status: AC
  Filled 2024-01-27: qty 30, 8d supply, fill #0

## 2024-01-27 MED ORDER — DIBUCAINE (PERIANAL) 1 % EX OINT: 1.0000 | TOPICAL_OINTMENT | CUTANEOUS | Status: DC | PRN

## 2024-01-27 MED ORDER — IBUPROFEN 600 MG PO TABS
600.0000 mg | ORAL_TABLET | Freq: Four times a day (QID) | ORAL | Status: DC
  Administered 2024-01-27 (×2): 600 mg via ORAL
  Filled 2024-01-27 (×2): qty 1

## 2024-01-27 MED ORDER — SENNOSIDES-DOCUSATE SODIUM 8.6-50 MG PO TABS: 2.0000 | ORAL_TABLET | Freq: Every day | ORAL | Status: DC

## 2024-01-27 MED ORDER — ONDANSETRON HCL 4 MG/2ML IJ SOLN: 4.0000 mg | INTRAMUSCULAR | Status: DC | PRN

## 2024-01-27 MED ORDER — ACETAMINOPHEN 325 MG PO TABS
650.0000 mg | ORAL_TABLET | ORAL | Status: DC | PRN
  Administered 2024-01-27: 650 mg via ORAL
  Filled 2024-01-27: qty 2

## 2024-01-27 MED ORDER — PRENATAL MULTIVITAMIN CH
1.0000 | ORAL_TABLET | Freq: Every day | ORAL | Status: DC
  Administered 2024-01-27: 1 via ORAL
  Filled 2024-01-27: qty 1

## 2024-01-27 MED ORDER — WITCH HAZEL-GLYCERIN EX PADS: 1.0000 | MEDICATED_PAD | CUTANEOUS | Status: DC | PRN

## 2024-01-27 NOTE — Discharge Summary (Signed)
 Postpartum Discharge Summary  An extra DC Summary was written by Rondel Coddington on day of discharge.      Patient Name: Alicia Turner DOB: 05-16-1996 MRN: [id number]  Date of admission: 01/26/2024 Delivery date:01/26/2024 Delivering provider: Harlee Lichtenstein Date of discharge: 01/29/2024  Admitting diagnosis: Oligohydramnios [O41.00X0] Intrauterine pregnancy: [redacted]w[redacted]d     Secondary diagnosis:  Principal Problem:   Oligohydramnios Active Problems:   Hyperthyroidism affecting pregnancy, antepartum   Vaginal delivery  Additional problems:  -obesity affecting pregnancy -CF carrier Augmentation: AROM, Pitocin , and IP Foley Complications: None  Hospital course: Induction of Labor With Vaginal Delivery   28 y.o. yo [phone] at [redacted]w[redacted]d was admitted to the hospital 01/26/2024 for induction of labor.  Indication for induction:  oligohydramnios .  Patient had an uncomplicated labor course.  Membrane Rupture Time/Date: 9:35 PM,01/26/2024  Delivery Method:Vaginal, Spontaneous Operative Delivery:N/A Episiotomy: None Lacerations:  None  Patient is discharged home 01/29/24.  Newborn Data: Birth date:01/26/2024 Birth time:11:40 PM Gender:Female Living status:Living Apgars:7 ,9  Weight:6 lb 5.9 oz (2.89 kg)  Immunizations received: There is no immunization history for the selected administration types on file for this patient.  Physical exam  Vitals:   01/27/24 0145 01/27/24 0316 01/27/24 0820 01/27/24 1149  BP: 129/82 112/70 119/78 130/86  Pulse: 100 82 67 87  Resp: 18 18 18 18   Temp: 98 F (36.7 C) 98.5 F (36.9 C) 98.5 F (36.9 C) 98.7 F (37.1 C)  TempSrc: Oral Oral Oral Oral  SpO2: 99% 98% 99% 99%  Weight:      Height:      Labs: Lab Results  Component Value Date   WBC 8.7 01/26/2024   HGB 11.7 (L) 01/26/2024   HCT 34.5 (L) 01/26/2024   MCV 87.1 01/26/2024   PLT 220 01/26/2024      Latest Ref Rng & Units 01/05/2024   12:00 PM  CMP  Glucose 70 - 99 mg/dL 80   BUN  6 - 20 mg/dL 4   Creatinine 1.47 - 8.29 mg/dL 5.62   Sodium 130 - 865 mmol/L 137   Potassium 3.5 - 5.2 mmol/L 4.1   Chloride 96 - 106 mmol/L 103   CO2 20 - 29 mmol/L 21   Calcium 8.7 - 10.2 mg/dL 8.9   Total Protein 6.0 - 8.5 g/dL 5.7   Total Bilirubin 0.0 - 1.2 mg/dL 0.5   Alkaline Phos 44 - 121 IU/L 112   AST 0 - 40 IU/L 20   ALT 0 - 32 IU/L 10    Edinburgh Score:    01/27/2024    1:45 AM  Edinburgh Postnatal Depression Scale Screening Tool  I have been able to laugh and see the funny side of things. 0  I have looked forward with enjoyment to things. 0  I have blamed myself unnecessarily when things went wrong. 1  I have been anxious or worried for no good reason. 1  I have felt scared or panicky for no good reason. 0  Things have been getting on top of me. 1  I have been so unhappy that I have had difficulty sleeping. 0  I have felt sad or miserable. 1  I have been so unhappy that I have been crying. 0  The thought of harming myself has occurred to me. 0  Edinburgh Postnatal Depression Scale Total 4   No data recorded  After visit meds:  Allergies as of 01/27/2024   No Known Allergies  Medication List     STOP taking these medications    ferrous sulfate  325 (65 FE) MG tablet   pantoprazole  40 MG tablet Commonly known as: Protonix        TAKE these medications    hydrOXYzine  25 MG tablet Commonly known as: ATARAX  Take 1 tablet (25 mg total) by mouth at bedtime.   ibuprofen  600 MG tablet Commonly known as: ADVIL  Take 1 tablet (600 mg total) by mouth every 6 (six) hours.   PRENATAL VITAMIN PO Take 1 tablet by mouth daily.   sertraline 50 MG tablet Commonly known as: ZOLOFT Take 100 mg by mouth daily.   VITAMIN D PO Take by mouth.        Future Appointments: Future Appointments  Date Time Provider Department Center  02/04/2024  2:00 PM [facility]  02/16/2024  4:15 PM Ferdie Housekeeper, MD [facility]  03/11/2024  1:35 PM  [name], [place] , CNM [facility]   Follow up Visit:  Follow-up Information     [facility] Follow up in 4 week(s).   Specialty: Obstetrics and Gynecology Why: For postpartum visit in 4-6 weeks  Will schedule tubal ligation consultation appointment as soon as possible. Contact information: [address] [place]  [address] [phone]        [facility] Follow up.   Specialty: Obstetrics and Gynecology Why: As needed in emergencies Contact information: [address] [place]  [address] [phone]                 01/29/2024 Holmes Lusher, CNM

## 2024-01-27 NOTE — Discharge Summary (Signed)
 Postpartum Discharge Summary     Patient Name: Alicia Turner DOB: 13-May-1996 MRN: [id number]  Date of admission: 01/26/2024 Delivery date:01/26/2024 Delivering provider: Harlee Lichtenstein Date of discharge: 01/27/2024  Admitting diagnosis: Oligohydramnios [O41.00X0] Intrauterine pregnancy: [redacted]w[redacted]d     Secondary diagnosis:  Principal Problem:   Oligohydramnios Active Problems:   Hyperthyroidism affecting pregnancy, antepartum   Vaginal delivery  Additional problems: none    Discharge diagnosis:  Oligohydramnios, Spontaneous vaginal delivery, postpartum tubal ligation                                               Post partum procedures:postpartum tubal ligation Augmentation: AROM, Pitocin , and IP Foley Complications: None  Hospital course: Induction of Labor With Vaginal Delivery   28 y.o. yo [phone] at [redacted]w[redacted]d was admitted to the hospital 01/26/2024 for induction of labor.  Indication for induction:  Oligohydramnios .  Patient had an labor course complicated by variable decelerations treated with amnioinfusion.  Membrane Rupture Time/Date: 9:35 PM,01/26/2024  Delivery Method:Vaginal, Spontaneous Operative Delivery:N/A Episiotomy: None Lacerations:  None Details of delivery can be found in separate delivery note.  Patient had a postpartum course complicated by nothing, but baby was transferred to NICU for severe jaundice. Pt strongly requested D/C prior to 24 hours PP to be with her baby. Pt was deemed stable ans discharged.  Patient is discharged home 01/27/24.  Newborn Data: Birth date:01/26/2024 Birth time:11:40 PM Gender:Female Living status:Living Apgars:7 ,9  Weight:2890 g  Magnesium Sulfate received: No BMZ received: No Rhophylac:No MMR:No T-DaP:Given prenatally Flu: No RSV Vaccine received: No Transfusion:No Immunizations administered: There is no immunization history for the selected administration types on file for this patient.  Physical exam  Vitals:   01/27/24  0145 01/27/24 0316 01/27/24 0820 01/27/24 1149  BP: 129/82 112/70 119/78 130/86  Pulse: 100 82 67 87  Resp: 18 18 18 18   Temp: 98 F (36.7 C) 98.5 F (36.9 C) 98.5 F (36.9 C) 98.7 F (37.1 C)  TempSrc: Oral Oral Oral Oral  SpO2: 99% 98% 99% 99%  Weight:      Height:       General: alert, cooperative, and no distress Lochia: appropriate Uterine Fundus: firm Incision: N/A DVT Evaluation: No evidence of DVT seen on physical exam. Labs: Lab Results  Component Value Date   WBC 8.7 01/26/2024   HGB 11.7 (L) 01/26/2024   HCT 34.5 (L) 01/26/2024   MCV 87.1 01/26/2024   PLT 220 01/26/2024      Latest Ref Rng & Units 01/05/2024   12:00 PM  CMP  Glucose 70 - 99 mg/dL 80   BUN 6 - 20 mg/dL 4   Creatinine 1.30 - 8.65 mg/dL 7.84   Sodium 696 - 295 mmol/L 137   Potassium 3.5 - 5.2 mmol/L 4.1   Chloride 96 - 106 mmol/L 103   CO2 20 - 29 mmol/L 21   Calcium 8.7 - 10.2 mg/dL 8.9   Total Protein 6.0 - 8.5 g/dL 5.7   Total Bilirubin 0.0 - 1.2 mg/dL 0.5   Alkaline Phos 44 - 121 IU/L 112   AST 0 - 40 IU/L 20   ALT 0 - 32 IU/L 10    Edinburgh Score:    01/27/2024    1:45 AM  Edinburgh Postnatal Depression Scale Screening Tool  I have been able to laugh and see  the funny side of things. 0  I have looked forward with enjoyment to things. 0  I have blamed myself unnecessarily when things went wrong. 1  I have been anxious or worried for no good reason. 1  I have felt scared or panicky for no good reason. 0  Things have been getting on top of me. 1  I have been so unhappy that I have had difficulty sleeping. 0  I have felt sad or miserable. 1  I have been so unhappy that I have been crying. 0  The thought of harming myself has occurred to me. 0  Edinburgh Postnatal Depression Scale Total 4      After visit meds:  Allergies as of 01/27/2024   No Known Allergies      Medication List     STOP taking these medications    ferrous sulfate  325 (65 FE) MG tablet    pantoprazole  40 MG tablet Commonly known as: Protonix        TAKE these medications    hydrOXYzine  25 MG tablet Commonly known as: ATARAX  Take 1 tablet (25 mg total) by mouth at bedtime.   ibuprofen  600 MG tablet Commonly known as: ADVIL  Take 1 tablet (600 mg total) by mouth every 6 (six) hours.   PRENATAL VITAMIN PO Take 1 tablet by mouth daily.   sertraline 50 MG tablet Commonly known as: ZOLOFT Take 100 mg by mouth daily.   VITAMIN D PO Take by mouth.         Discharge home in stable condition Infant Feeding: Breast Infant Disposition:NICU Discharge instruction: per After Visit Summary and Postpartum booklet. Activity: Advance as tolerated. Pelvic rest for 6 weeks.  Diet: routine diet Anticipated Birth Control: Plans Interval BTL Postpartum Appointment:4 weeks Additional Postpartum F/U:  BTL consult ASAP Future Appointments: No future appointments.  Follow up Visit:  Follow-up Information     [facility] Follow up in 4 week(s).   Specialty: Obstetrics and Gynecology Why: For postpartum visit in 4-6 weeks  Will schedule tubal ligation consultation appointment as soon as possible. Contact information: [address] [place]  [phone]        [facility] Follow up.   Specialty: Obstetrics and Gynecology Why: As needed in emergencies Contact information: [address] [place]  [address] [phone]                    01/27/2024 Alicia Turner  Alicia Turner, [place]

## 2024-01-27 NOTE — Social Work (Signed)
 CSW acknowledged consult NICU admission and completed a clinical assessment.  There are no barriers to d/c.  Clinical assessment notes will be entered at a later time.  Nickolas Barr, MSW, LCSW Clinical Social Worker  [phone] 01/27/2024  4:03 PM

## 2024-01-27 NOTE — Lactation Note (Signed)
 This note was copied from a baby's chart. Lactation Consultation Note  Patient Name: Alicia Turner [id number] Date: 01/27/2024  Reason for consult: Initial assessment;Early term 37-38.6wks;Other (Comment);Hyperbilirubinemia (+DAT)   P3, 38 wks, @ 11 hrs of life. Mom independently latches baby well in LC presence. Encouraged starting with hand expression, use breast compression during and keep baby awake/ working @ breast. Encouraged working on Heritage manager, discussed feeding every 3 hours- mom has been offering breast more often- encouraged hand expression and feeding off spoon if baby not working @ breast. Hand pump provided, set up DEBP, did not complete cycle together. Mom just finding out baby moving to NICU.  Discussed expectations @ breast- Day 1- sleepy/ feed every 3 hrs/ even 10 minutes is okay, Day 2 more awake/ feeding cues/longer feeds, and cluster feeding overnights brings milk in. Highlighted breast stimulation is tied directly to milk production. Discussed hands on breast and baby, keeping baby awake @ breast. Starting with hand expression & breast compression to get baby working @ breast, and gentle stimulation to keep baby working @ breast.  LC services and milk storage shared. Encouraged mom to call for assist anytime desired.  Maternal Data Has patient been taught Hand Expression?: Yes Does the patient have breastfeeding experience prior to this delivery?: Yes How long did the patient breastfeed?: 1st baby- 10 months, 2nd baby- 3 months  Feeding Mother's Current Feeding Choice: Breast Milk  LATCH Score Latch: Grasps breast easily, tongue down, lips flanged, rhythmical sucking.  Audible Swallowing: Spontaneous and intermittent  Type of Nipple: Everted at rest and after stimulation  Comfort (Breast/Nipple): Soft / non-tender  Hold (Positioning): No assistance needed to correctly position infant at breast.  LATCH Score: 10   Lactation Tools Discussed/Used     Interventions Interventions: Breast feeding basics reviewed;Hand express;Breast compression;Hand pump;DEBP;Education;LC Services brochure;CDC milk storage guidelines  Discharge Pump: Manual;Hands Free;Personal  Consult Status Consult Status: Follow-up Date: 01/27/24    Firman Hughes 01/27/2024, 1:10 PM

## 2024-01-27 NOTE — Lactation Note (Signed)
 This note was copied from a baby's chart. Lactation Consultation Note  Patient Name: Alicia Turner [id number] Date: 01/27/2024 Age:29 hours  Attempted to see mom but she had a lot of people in her room, connecting baby to photo therapy. Mom is pre-occupied. LC attempted to talk w/mom but it is to much going on right now in rm.    Maternal Data    Feeding    LATCH Score                    Lactation Tools Discussed/Used    Interventions    Discharge    Consult Status      Alicia Turner 01/27/2024, 6:39 AM

## 2024-01-27 NOTE — Progress Notes (Signed)
 Post Partum Day 1 Subjective: no complaints, up ad lib, voiding, and tolerating PO Has some low back pain at times  Objective: Blood pressure 112/70, pulse 82, temperature 98.5 F (36.9 C), temperature source Oral, resp. rate 18, height 5' 5.5" (1.664 m), weight 100.8 kg, last menstrual period 04/21/2023, SpO2 98%, unknown if currently breastfeeding.  Physical Exam:  General: alert, cooperative, and no distress Lochia: appropriate Uterine Fundus: firm Incision: n/a DVT Evaluation: No evidence of DVT seen on physical exam.  Recent Labs    01/26/24 1910  HGB 11.7*  HCT 34.5*    Assessment/Plan: Plan for discharge tomorrow and Breastfeeding Baby under Bili Lights   LOS: 1 day   Holmes Lusher, CNM 01/27/2024, 6:50 AM

## 2024-01-27 NOTE — Clinical Social Work Maternal (Addendum)
 CLINICAL SOCIAL WORK MATERNAL/CHILD NOTE  Patient Details  Name: Alicia Turner MRN: [id number] Date of Birth: 06/29/1996  Date:  March 16, 2024  Clinical Social Worker Initiating Note:  Alicia Turner, [place] Date/Time: Initiated:  01/27/24/1604     Child's Name:  Alicia Turner   Biological Parents:  Mother (MOB: Alicia Turner (11/20/1994))   Need for Interpreter:  None   Reason for Referral:  Other (Comment), Behavioral Health Concerns (NICU admission)   Address:  [address] [place] [address]    Phone number:  [phone] (home)     Additional phone number:   Household Members/Support Persons (HM/SP):   Household Member/Support Person 1, Household Member/Support Person 2, Household Member/Support Person 3, Household Member/Support Person 4   HM/SP Name Relationship DOB or Age  HM/SP -1 Alicia Turner Father 06/30/1965  HM/SP -2 Alicia Turner Mother 03/09/1970  HM/SP -3 Alicia Turner Son 11/18/2020  HM/SP -4 Alicia Turner 12/20/2017  HM/SP -5        HM/SP -6        HM/SP -7        HM/SP -8          Natural Supports (not living in the home):      Professional Supports: None   Employment: Consulting civil engineer   Type of Work:     Education:  Attending college   Homebound arranged:    Surveyor, quantity Resources:  Medicaid   Other Resources:  Sales executive  , [employer]   Cultural/Religious Considerations Which May Impact Care:    Strengths:  Ability to meet basic needs  , Home prepared for child  , Psychotropic Medications   Psychotropic Medications:  Zoloft, Other meds (Hydroxyzine PRN)      Pediatrician:       Pediatrician List:   [facility]   [place]    [place]      Pediatrician Fax Number:    Risk Factors/Current Problems:  Mental Health Concerns     Cognitive State:  Able to Concentrate  , Goal Oriented  , Alert  , Insightful     Mood/Affect:  Comfortable  , Calm  ,  Interested     CSW Assessment: CSW received consult for NICU admission and Anxiety and Depression. CSW met with MOB to offer support and complete assessment.   CSW met with MOB at bedside and introduced CSW role. During the visit, CSW observed that MOB up in the room, prepared to discharge and ready to join the infant in the NICU. MOB presented pleasant, calm and welcomed CSW visit. CSW asked MOB if the demographic information on hospital file was correct. MOB confirmed that the information on file was correct. CSW asked MOB how she had been doing. MOB shared that she had a natural birth without medication, which went quite well. However, she reported feeling sad and anxious after learning that her baby needed to be admitted to the NICU. MOB expressed concerns about their separation and her anxiety regarding the infant's condition. CSW validated MOB feelings. MOB mentioned that the neonatologist was attentive to her feelings and concerns about the NICU admission. MOB shared her grandmother years ago had passed away while in a hospital, which added to her anxieties. MOB reported during her discussion with the neonatologist she felt somewhat reassured and calmer as after sharing her concerns. CSW acknowledged MOB bravery to communicate her concerns.  CSW  asked MOB if the NICU staff had kept her updated about infant's care. MOB reported that the NICU staff had kept her well informed.  CSW discussed NICU available support services. MOB reported that she understood. CSW offered to check in with MOB to offer support. MOB expressed hope that their NICU stay would not be long and she welcomed CSW to check in. CSW inquired about MOB support system. MOB identified her parents as her primary supports as FOB does not live locally.  CSW inquired about MOB mental health history. MOB reported experiencing depression and anxiety. She stated that she is currently taking Zoloft for depression which her provider increased her  dosage to 100mg  during the pregnancy and added Hydroxyzine PRN for anxiety symptoms and sleep. MOB reported she feels this combination of medication is very effective. MOB reported that she had PPD symptoms during her last pregnancy as it related to her son also having a NICU admission and she placed on bed rest for most of the pregnancy which contributed to her depression symptoms. MOB reported that she will continue to take her mental health medication throughout the postpartum period and feels comfortable reaching out to her provider if she has concerns. MOB mentioned that she is interested in-person therapy and is currently on the waiting list at [facility] for services. CSW acknowledged MOB efforts and offered her additional mental health resources. MOB shared that she is just grateful to be a mom to her boy and being a student brings her joy. She mentioned that she has a 4.0 GPA. CSW acknowledged MOB's ability to acknowledge her own feelings of joy. CSW assessed MOB for safety. MOB denied SI/HI and domestic violence concerns.  CSW asked MOB if she had items to care for the infant. MOB reported that she has all items to care for the infant at home including a crib and car seat. CSW asked MOB if she received SNAP benefits. MOB reported that she receives both [facility] and food stamps and would call to notify each agency about the birth. MOB reported that the infant will see pediatrician at [facility]. CSW assessed MOB for additional needs. MOB reported none.   CSW Plan/Description:  Perinatal Mood and Anxiety Disorder (PMADs) Education, No Further Intervention Required/No Barriers to Discharge    Rebecka Can, LCSW 06/26/24, 4:21 PM

## 2024-01-27 NOTE — Lactation Note (Signed)
 This note was copied from a baby's chart.  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 Ms. Fines is a P3 and experienced breastfeeding; her plan is to primarily breastfeed once baby is off the lights, but she's amenable to pump and bottle feed in the meantime. She has Hx of breast biopsies on both breasts, but both masses resolved on their own and didn't need further intervention. She also voiced this didn't affect her supply with her previous babies. Provided a pumping band in size "L" for hands on pumping and assisted with her pumping session while on [facility] consult, praised her for all her efforts. She requested to be discharged from the [facility] today in order to room in with baby in the NICU. Reviewed discharge education, pumping schedule, pumping log, lactogenesis II and anticipatory guidelines.   OBJECTIVE Infant data: Mother's Current Feeding Choice: Breast Milk  O2 Device: Nasal Cannula O2 Flow Rate (L/min): 1 L/min FiO2 (%): 25 %  Infant feeding assessment IDFTS - Readiness: 2   Maternal data: [id number] Vaginal, Spontaneous Has patient been taught Hand Expression?: Yes Significant Breast History:: (+) breast changes during the pregnancy; R breast had biopsy in 06/16/2020 and L breast in 2019 Current breast feeding challenges:: NICU admission Does the patient have breastfeeding experience prior to this delivery?: Yes How long did the patient breastfeed?: 1st baby- 10 months, 2nd baby- 3 months Pumping frequency: initiated pumping at 14 hours post-partum; baby has been breastfeeding prior NICU  admission Pumped volume: 7 mL Flange Size: 21 Hands-free pumping top sizes: Large Martina Sledge) Risk factor for low/delayed milk supply:: infant separation  Pump: Manual, Hands Free (wearable pump at home)  ASSESSMENT Infant: Feeding method: Bottle  Maternal: Milk volume: Normal  INTERVENTIONS/PLAN Interventions: Interventions: Breast feeding basics reviewed; Coconut oil; DEBP; Education; CDC Guidelines for Breast Pump Cleaning; NICU Pumping Log Discharge Education: Engorgement and breast care Tools: Pump; Flanges; Coconut oil; Hands-free pumping top Pump Education: Setup, frequency, and cleaning; Milk Storage  Plan: Pump every 3 hours for 15 minutes on initiate mode; ideally 8 pumping sessions/24 hours Bring all pump pieces to baby's room after her discharge from the [facility] Switch pump settings from initiate to maintain mode once expressing +20 ml of EBM combined  PGM present. All questions and concerns answered, family to contact [facility] services PRN.  Consult Status: NICU follow-up NICU Follow-up type: Verify absence of engorgement; Verify onset of copious milk   Martita Brumm S Xavier Munger 01/27/2024, 4:20 PM

## 2024-01-28 ENCOUNTER — Encounter (HOSPITAL_COMMUNITY)
Admission: AD | Disposition: A | Discharge status: Home / Self Care | Payer: Self-pay | Source: Home / Self Care | Attending: Obstetrics and Gynecology

## 2024-01-28 ENCOUNTER — Encounter: Payer: Self-pay | Admitting: Obstetrics & Gynecology

## 2024-01-28 SURGERY — LIGATION, FALLOPIAN TUBE, POSTPARTUM
Anesthesia: Epidural

## 2024-01-29 ENCOUNTER — Encounter: Admitting: Obstetrics & Gynecology

## 2024-02-04 ENCOUNTER — Ambulatory Visit

## 2024-02-05 ENCOUNTER — Ambulatory Visit (HOSPITAL_COMMUNITY): Payer: Self-pay

## 2024-02-05 ENCOUNTER — Encounter: Payer: Self-pay | Admitting: Obstetrics & Gynecology

## 2024-02-05 NOTE — Lactation Note (Signed)
 This note was copied from a baby's chart.  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 Eckersley reported she continues pumping consistently and has an abundant supply. She feels comfortable with her supply; she voiced she plans on using her breastmilk for baths, to make baby food and others. Ms. Freehill is taking baby "Alicia Turner" home today. Reviewed discharge education and the importance of "some" pumping after feedings/attempts at the breast for the prevention of engorgement. She already has an appt scheduled with LC OP for 02/12/24 at the [facility] to F/U with lactation. No other support person at this time. All questions and concerns answered, family to contact [facility] services PRN.  OBJECTIVE Infant data: Mother's Current Feeding Choice: Breast Milk  O2 Device: Room Air  Infant feeding assessment IDFTS - Readiness: 1 IDFTS - Quality: 2   Maternal data: [id number] Vaginal, Spontaneous Pumping frequency: 6-8 times/24 hours Pumped volume: 300 mL (300-390 ml after putting baby to breast; otherwise is 480 ml)  Pump: Manual, Hands Free (wearable pump at home)  ASSESSMENT Infant: Latch: Grasps breast easily, tongue down, lips flanged, rhythmical sucking. Audible Swallowing: Spontaneous and intermittent Type of Nipple: Everted at rest and after stimulation Comfort (Breast/Nipple): Soft / non-tender Hold (Positioning): No assistance needed to correctly position infant at breast. LATCH Score: 10  Feeding Status: Ad lib Feeding method: Bottle Nipple Type: Dr. Michelene Ahmadi Preemie  Maternal: Milk volume: Abundant  INTERVENTIONS/PLAN Interventions: Interventions: Breast  feeding basics reviewed; DEBP; Education Discharge Education: Outpatient recommendation; Outpatient Epic message sent  Plan: Consult Status: Complete NICU Follow-up type: Baby's discharge   Samar Dass Newman Bare 02/05/2024, 10:03 AM

## 2024-02-06 ENCOUNTER — Telehealth (HOSPITAL_COMMUNITY): Payer: Self-pay | Admitting: *Deleted

## 2024-02-06 NOTE — Telephone Encounter (Signed)
 02/06/2024  Name: Alicia Turner MRN: [id number] DOB: [birth year]  Reason for Call:  Transition of Care Hospital Discharge Call  Contact Status: Patient Contact Status: Message  Language assistant needed: Interpreter Mode: Interpreter Not Needed        Follow-Up Questions: Do You Have Any Concerns About Your Health As You Heal From Delivery?: No Do You Have Any Concerns About Your Infants Health?: No  Edinburgh Postnatal Depression Scale:  In the Past 7 Days: I have been able to laugh and see the funny side of things.: As much as I always could I have looked forward with enjoyment to things.: As much as I ever did I have blamed myself unnecessarily when things went wrong.: No, never I have been anxious or worried for no good reason.: Hardly ever I have felt scared or panicky for no good reason.: No, not at all Things have been getting on top of me.: No, I have been coping as well as ever I have been so unhappy that I have had difficulty sleeping.: Not very often I have felt sad or miserable.: Not very often I have been so unhappy that I have been crying.: Only occasionally The thought of harming myself has occurred to me.: Never Edinburgh Postnatal Depression Scale Total: 4  PHQ2-9 Depression Scale:     Discharge Follow-up: Edinburgh score requires follow up?: No Patient was advised of the following resources:: Support Group, Breastfeeding Support Group  Post-discharge interventions: Reviewed Newborn Safe Sleep Practices  Pearlie Bougie, RN 02/06/2024 11:20

## 2024-02-12 ENCOUNTER — Encounter: Payer: Self-pay | Admitting: Family Medicine

## 2024-02-12 ENCOUNTER — Other Ambulatory Visit: Payer: Self-pay

## 2024-02-12 ENCOUNTER — Ambulatory Visit

## 2024-02-12 DIAGNOSIS — Z4889 Encounter for other specified surgical aftercare: Secondary | ICD-10-CM

## 2024-02-12 NOTE — Progress Notes (Signed)
 Pt initially scheduled for a BTL incision check. After pt was brought back to the room, pt informed RN that BTL was not completed in the hospital due to early discharge. Rn verified per chart review. Pt reported she was told to schedule a consultation with a provider to schedule outpatient BTL. The nurse visit for incision check was likely scheduled before the cancellation of initial BTL. Outpatient BTL consultation is scheduled for 02/16/2024. Pt voiced understanding of above information.   Arnell Lange, RN

## 2024-02-16 ENCOUNTER — Ambulatory Visit: Admitting: Family Medicine

## 2024-03-11 ENCOUNTER — Ambulatory Visit: Admitting: Advanced Practice Midwife

## 2024-04-05 ENCOUNTER — Ambulatory Visit: Admitting: Advanced Practice Midwife

## 2024-08-17 ENCOUNTER — Other Ambulatory Visit: Payer: Self-pay

## 2024-08-17 ENCOUNTER — Encounter: Payer: Self-pay | Admitting: Emergency Medicine

## 2024-08-17 ENCOUNTER — Ambulatory Visit (INDEPENDENT_AMBULATORY_CARE_PROVIDER_SITE_OTHER)

## 2024-08-17 ENCOUNTER — Ambulatory Visit
Admission: EM | Admit: 2024-08-17 | Discharge: 2024-08-17 | Disposition: A | Discharge status: Home / Self Care | Attending: Student | Admitting: Student

## 2024-08-17 DIAGNOSIS — R051 Acute cough: Secondary | ICD-10-CM

## 2024-08-17 DIAGNOSIS — Z3202 Encounter for pregnancy test, result negative: Secondary | ICD-10-CM

## 2024-08-17 LAB — POCT URINE PREGNANCY: Preg Test, Ur: NEGATIVE

## 2024-08-17 MED ORDER — PROMETHAZINE-DM 6.25-15 MG/5ML PO SYRP: 5.0000 mL | ORAL_SOLUTION | Freq: Four times a day (QID) | ORAL | 0 refills | Status: AC | PRN

## 2024-08-17 MED ORDER — AZELASTINE HCL 0.1 % NA SOLN: 2.0000 | Freq: Two times a day (BID) | NASAL | 2 refills | Status: AC

## 2024-08-17 NOTE — Discharge Instructions (Addendum)
-   Your x-ray looks normal.  You do not have pneumonia. -You been diagnosed with a viral illness today.  -Promethazine DM cough syrup for congestion/cough. This could make you drowsy, so take at night before bed. - Use the azelastine nasal spray for nasal congestion, 2 sprays twice daily while symptoms persist. -For pain (including throat pain!) and fever: -Take tylenol  (up to 3000mg /day) and ibuprofen  (up to 3200mg /day). Take ibuprofen  with food. If you are taking ibuprofen , do not take additional NSAIDs, like alleve, naproxen, meloxicam, etc. -Viruses have to run their course and medicines that are prescribed are meant to help with symptoms. Antibiotics are not effective for viruses. - With viruses usually feel poorly from 3 to 7 days with cough being the last symptoms to resolve. Cough can linger from days to weeks.   -If your cough lasts more than 2 weeks and you are coughing so hard that you are vomiting or feel like you could pass out we need to follow-up with PCP for further testing and evaluation. -Rest, increase water intake -May use pseudoephedrine (Sudafed) for nasal congestion. You must ask the pharmacist for this medication, though it does not require a prescription, -May use Delsym (dextromethorphan) or honey as needed for cough,  -If you feel poorly (fever, fatigue, shortness of breath, nausea, etc.) for more than 10 days; or if your symptoms improve and then worsen again around day 10; be sure to follow-up with PCP or in clinic for further evaluation and additional treatments. -If you experience chest pain with shortness of breath or pulse oxygen less than 95% you should report to the ER.

## 2024-08-17 NOTE — ED Provider Notes (Signed)
 [name]    CSN: [id number] Arrival date & time: 08/17/24  1858      History   Chief Complaint Chief Complaint  Patient presents with   Cough    HPI Alicia Turner is a 28 y.o. female presenting with viral symptoms x5 days. Pt sts cough, body aches, ear and throat pain with fatigue x 5 days; denies fever. Has attempted Tylenol , ibuprofen , robitussen, children's mucinex that she had on hand. Notes recent travel.  HPI  Past Medical History:  Diagnosis Date   Anemia    Eczema    Erythema nodosum    Hyperglycemia    Hyperthyroidism    Left breast lump    Non-reassuring fetal heart rate or rhythm affecting mother 11/17/2020    Patient Active Problem List   Diagnosis Date Noted   Vaginal delivery 01/27/2024   Oligohydramnios 01/26/2024   Unwanted fertility 01/05/2024   History of erythema nodosum 11/12/2023   Subchorionic hemorrhage 11/12/2023   Supervision of high risk pregnancy, antepartum 10/31/2023   Hyperthyroidism affecting pregnancy, antepartum 08/20/2023   Obesity affecting pregnancy, antepartum 08/20/2023   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 Authorizing Provider  azelastine (ASTELIN) 0.1 % nasal spray Place 2 sprays into both nostrils 2 (two) times daily. Use in each nostril as directed 08/17/24  Yes Alicia Leita BRAVO, PA-C  promethazine-dextromethorphan (PROMETHAZINE-DM) 6.25-15 MG/5ML syrup Take 5 mLs by mouth 4 (four) times daily as needed for up to 7 days for cough. 08/17/24 08/24/24 Yes Shuntel Fishburn,  Alicia Turner E, PA-C  hydrOXYzine  (ATARAX ) 25 MG tablet Take 1 tablet (25 mg total) by mouth at bedtime. 11/17/23   Alicia Nidia CROME, FNP  ibuprofen  (ADVIL ) 600 MG tablet Take 1 tablet (600 mg total) by mouth every 6 (six) hours. 01/27/24   Turner, Alicia , CNM  Prenatal Vit-Fe Fumarate-FA (PRENATAL VITAMIN PO) Take 1 tablet by mouth daily.    [provider]  sertraline (ZOLOFT) 50 MG tablet Take 100 mg by mouth daily.    [provider]  VITAMIN D PO Take by mouth.    [provider]    Family History Family History  Problem Relation Age of Onset   Arthritis Mother        rheumatoid   Hypertension Mother    Hyperlipidemia Mother    Diabetes Mellitus II Mother    Healthy Father    Thyroid  cancer Maternal Aunt     Social History Social History   Tobacco Use   Smoking status: Never   Smokeless tobacco: Never  Vaping Use   Vaping status: Never Used  Substance Use Topics   Alcohol use: Not Currently    Comment: occasionally   Drug use: Never     Allergies   Patient has  no known allergies.   Review of Systems Review of Systems  Constitutional:  Negative for appetite change, chills and fever.  HENT:  Positive for congestion. Negative for ear pain, rhinorrhea, sinus pressure, sinus pain and sore throat.   Eyes:  Negative for redness and visual disturbance.  Respiratory:  Positive for cough. Negative for chest tightness, shortness of breath and wheezing.   Cardiovascular:  Negative for chest pain and palpitations.  Gastrointestinal:  Negative for abdominal pain, constipation, diarrhea, nausea and vomiting.  Genitourinary:  Negative for dysuria, frequency and urgency.  Musculoskeletal:  Positive for myalgias.  Neurological:  Negative for dizziness, weakness and headaches.  Psychiatric/Behavioral:  Negative for confusion.   All other systems reviewed and are negative.    Physical Exam Triage Vital Signs ED Triage Vitals [08/17/24 1917]  Encounter  Vitals Group     BP 115/79     Girls Systolic BP Percentile      Girls Diastolic BP Percentile      Boys Systolic BP Percentile      Boys Diastolic BP Percentile      Pulse Rate 79     Resp 18     Temp 98.2 F (36.8 C)     Temp Source Oral     SpO2 98 %     Weight      Height      Head Circumference      Peak Flow      Pain Score 8     Pain Loc      Pain Education      Exclude from Growth Chart    No data found.  Updated Vital Signs BP 115/79 (BP Location: Left Arm)   Pulse 79   Temp 98.2 F (36.8 C) (Oral)   Resp 18   SpO2 98%   Visual Acuity Right Eye Distance:   Left Eye Distance:   Bilateral Distance:    Right Eye Near:   Left Eye Near:    Bilateral Near:     Physical Exam Vitals reviewed.  Constitutional:      General: She is not in acute distress.    Appearance: Normal appearance. She is not ill-appearing.  HENT:     Head: Normocephalic and atraumatic.     Right Ear: Tympanic membrane, ear canal and external ear normal. No tenderness. No middle ear effusion. There is no impacted cerumen. Tympanic membrane is not perforated, erythematous, retracted or bulging.     Left Ear: Tympanic membrane, ear canal and external ear normal. No tenderness.  No middle ear effusion. There is no impacted cerumen. Tympanic membrane is not perforated, erythematous, retracted or bulging.     Nose: Nose normal. No congestion.     Mouth/Throat:     Mouth: Mucous membranes are moist.     Pharynx: Uvula midline. No oropharyngeal exudate or posterior oropharyngeal erythema.     Tonsils: 1+ on the right. 1+ on the left.     Comments: Tonsils are small and nonerythematous Eyes:     Extraocular Movements: Extraocular movements intact.     Pupils: Pupils are equal, round, and reactive to light.  Cardiovascular:     Rate and Rhythm: Normal rate and regular rhythm.     Heart sounds: Normal heart sounds.  Pulmonary:     Effort: Pulmonary effort is normal.     Breath sounds: Normal  breath sounds. No decreased breath sounds, wheezing, rhonchi or rales.  Abdominal:     Palpations: Abdomen is soft.  Tenderness: There is no abdominal tenderness. There is no guarding or rebound.  Lymphadenopathy:     Cervical: No cervical adenopathy.     Right cervical: No superficial cervical adenopathy.    Left cervical: No superficial cervical adenopathy.  Neurological:     General: No focal deficit present.     Mental Status: She is alert and oriented to person, place, and time.  Psychiatric:        Mood and Affect: Mood normal.        Behavior: Behavior normal.        Thought Content: Thought content normal.        Judgment: Judgment normal.      UC Treatments / Results  Labs (all labs ordered are listed, but only abnormal results are displayed) Labs Reviewed  POCT URINE PREGNANCY - Normal    EKG   Radiology DG Chest 2 View Result Date: 08/17/2024 EXAM: 2 VIEW(S) XRAY OF THE CHEST 08/17/2024 07:35:39 PM COMPARISON: 07/26/2022 CLINICAL HISTORY: Patient requesting xray for cough. FINDINGS: LUNGS AND PLEURA: No focal pulmonary opacity. No pulmonary edema. No pleural effusion. No pneumothorax. HEART AND MEDIASTINUM: No acute abnormality of the cardiac and mediastinal silhouettes. BONES AND SOFT TISSUES: No acute osseous abnormality. IMPRESSION: 1. No acute cardiopulmonary process. Electronically signed by: Pinkie Pebbles MD 08/17/2024 07:36 PM EST RP Workstation: [id number]    Procedures Procedures (including critical care time)  Medications Ordered in UC Medications - No data to display  Initial Impression / Assessment and Plan / UC Course  I have reviewed the triage vital signs and the nursing notes.  Pertinent labs & imaging results that were available during my care of the patient were reviewed by me and considered in my medical decision making (see chart for details).     Patient is a 28 year old female presenting on day 5 of viral syndrome. The patient is  afebrile and nontachycardic.  Antipyretic has not been administered today.  Did not check COVID or influenza testing due to duration of symptoms. Urine pregnancy negative today. Low concern for pneumonia due to clear breath sounds and no history of pulmonary disease.  Chest x-ray performed at patient preference: 1. No acute cardiopulmonary process.   Will manage with azelastine, Promethazine DM, and over-the-counter medications as below.  Return precautions as below.  Final Clinical Impressions(s) / UC Diagnoses   Final diagnoses:  Acute cough  Negative pregnancy test     Discharge Instructions      - Your x-ray looks normal.  You do not have pneumonia. -You been diagnosed with a viral illness today.  -Promethazine DM cough syrup for congestion/cough. This could make you drowsy, so take at night before bed. - Use the azelastine nasal spray for nasal congestion, 2 sprays twice daily while symptoms persist. -For pain (including throat pain!) and fever: -Take tylenol  (up to 3000mg /day) and ibuprofen  (up to 3200mg /day). Take ibuprofen  with food. If you are taking ibuprofen , do not take additional NSAIDs, like alleve, naproxen, meloxicam, etc. -Viruses have to run their course and medicines that are prescribed are meant to help with symptoms. Antibiotics are not effective for viruses. - With viruses usually feel poorly from 3 to 7 days with cough being the last symptoms to resolve. Cough can linger from days to weeks.   -If your cough lasts more than 2 weeks and you are coughing so hard that you are vomiting or feel like you could pass out we need to follow-up with PCP for further testing and  evaluation. -Rest, increase water intake -May use pseudoephedrine (Sudafed) for nasal congestion. You must ask the pharmacist for this medication, though it does not require a prescription, -May use Delsym (dextromethorphan) or honey as needed for cough,  -If you feel poorly (fever, fatigue, shortness  of breath, nausea, etc.) for more than 10 days; or if your symptoms improve and then worsen again around day 10; be sure to follow-up with PCP or in clinic for further evaluation and additional treatments. -If you experience chest pain with shortness of breath or pulse oxygen less than 95% you should report to the ER.      ED Prescriptions     Medication Sig Dispense Auth. Provider   promethazine-dextromethorphan (PROMETHAZINE-DM) 6.25-15 MG/5ML syrup Take 5 mLs by mouth 4 (four) times daily as needed for up to 7 days for cough. 118 mL Kyleah Pensabene E, PA-C   azelastine (ASTELIN) 0.1 % nasal spray Place 2 sprays into both nostrils 2 (two) times daily. Use in each nostril as directed 30 mL Alicia Leita BRAVO, PA-C      PDMP not reviewed this encounter.   Alicia Leita BRAVO, PA-C 08/17/24 1955

## 2024-08-17 NOTE — ED Triage Notes (Signed)
 Pt sts cough, body aches, ear and throat pain with fatigue x 5 days; denies fever

## 2024-08-25 ENCOUNTER — Other Ambulatory Visit: Payer: Self-pay

## 2024-08-25 ENCOUNTER — Other Ambulatory Visit (HOSPITAL_COMMUNITY)
Admission: RE | Admit: 2024-08-25 | Discharge: 2024-08-25 | Disposition: A | Discharge status: Home / Self Care | Source: Ambulatory Visit | Attending: Family Medicine | Admitting: Family Medicine

## 2024-08-25 ENCOUNTER — Ambulatory Visit

## 2024-08-25 VITALS — BP 110/74 | HR 86 | Ht 65.5 in | Wt 203.7 lb

## 2024-08-25 DIAGNOSIS — N898 Other specified noninflammatory disorders of vagina: Secondary | ICD-10-CM

## 2024-08-25 NOTE — Progress Notes (Signed)
 Alicia Turner is here today for a self-swab. Patient states I may have a yeast infection. Patient states she is experiencing yellow vaginal discharge and strong vaginal odor.   I explained to [name] how to obtain a self-swab and she verbalized understanding. Also, explained to the patient if the results are abnormal we will be in contact. Patient verbalizes understanding. Patient states no further questions of concerns.   Devon, RN 08/25/24

## 2024-08-26 ENCOUNTER — Ambulatory Visit: Payer: Self-pay | Admitting: Certified Nurse Midwife

## 2024-08-26 DIAGNOSIS — B9689 Other specified bacterial agents as the cause of diseases classified elsewhere: Secondary | ICD-10-CM

## 2024-08-26 LAB — CERVICOVAGINAL ANCILLARY ONLY
Bacterial Vaginitis (gardnerella): POSITIVE — AB
Candida Glabrata: NEGATIVE
Candida Vaginitis: NEGATIVE
Chlamydia: NEGATIVE
Comment: NEGATIVE
Comment: NEGATIVE
Comment: NEGATIVE
Comment: NEGATIVE
Comment: NEGATIVE
Comment: NORMAL
Neisseria Gonorrhea: POSITIVE — AB
Trichomonas: NEGATIVE

## 2024-08-26 MED ORDER — METRONIDAZOLE 0.75 % VA GEL: 1.0000 | Freq: Every day | VAGINAL | 0 refills | Status: AC

## 2024-08-27 ENCOUNTER — Telehealth: Payer: Self-pay

## 2024-08-27 NOTE — Telephone Encounter (Signed)
 Patient left voicemail stating she reviewed her results from appointment on 08/25/24 and is requesting treatment as soon as possible.     Waddell, RN

## 2024-08-27 NOTE — Telephone Encounter (Signed)
 Results reviewed by Cornell Finder, CNM.  Treatment sent to pharmacy for BV.  Pt agreeable to nurse visit 08/30/24 at 10:40am for Rocephin  injection to treat Gonorrhea.    Waddell, RN

## 2024-08-27 NOTE — Progress Notes (Signed)
 Pt agreeable to nurse visit 08/30/24 at 10:40am for Rocepin injection to treat Gonorrhea.

## 2024-08-30 ENCOUNTER — Encounter: Payer: Self-pay | Admitting: General Practice

## 2024-08-30 ENCOUNTER — Other Ambulatory Visit: Payer: Self-pay

## 2024-08-30 ENCOUNTER — Ambulatory Visit (INDEPENDENT_AMBULATORY_CARE_PROVIDER_SITE_OTHER): Admitting: *Deleted

## 2024-08-30 VITALS — BP 101/62 | HR 74 | Ht 65.0 in | Wt 207.6 lb

## 2024-08-30 DIAGNOSIS — A549 Gonococcal infection, unspecified: Secondary | ICD-10-CM | POA: Diagnosis not present

## 2024-08-30 MED ORDER — CEFTRIAXONE SODIUM 500 MG IJ SOLR
500.0000 mg | Freq: Once | INTRAMUSCULAR | Status: AC
  Administered 2024-08-30: 500 mg via INTRAMUSCULAR

## 2024-08-30 NOTE — Progress Notes (Signed)
 Here for treatment of Gonorrhea. Given Rocephin  IM x1 per protocol without complaint. Advised partner should be treated and avoid contact until both treated and wait 1-2 weeks. She confirms 08/27/24. Advised schedule nurse visit for TOC in 3 months.  Rock Skip PEAK

## 2024-08-30 NOTE — Progress Notes (Unsigned)
 Communicable Disease Card sent to [facility]

## 2024-11-30 ENCOUNTER — Ambulatory Visit
